# Patient Record
Sex: Female | Born: 1940 | Race: White | Hispanic: No | Marital: Married | Smoking: Never smoker
Health system: Southern US, Community
[De-identification: ages and names within clinical notes are randomized; demographics above are authoritative.]

## PROBLEM LIST (undated history)

## (undated) DIAGNOSIS — M549 Dorsalgia, unspecified: Secondary | ICD-10-CM

## (undated) DIAGNOSIS — G629 Polyneuropathy, unspecified: Secondary | ICD-10-CM

## (undated) DIAGNOSIS — N189 Chronic kidney disease, unspecified: Secondary | ICD-10-CM

## (undated) DIAGNOSIS — C439 Malignant melanoma of skin, unspecified: Secondary | ICD-10-CM

## (undated) DIAGNOSIS — T753XXA Motion sickness, initial encounter: Secondary | ICD-10-CM

## (undated) DIAGNOSIS — M5416 Radiculopathy, lumbar region: Secondary | ICD-10-CM

## (undated) DIAGNOSIS — M1990 Unspecified osteoarthritis, unspecified site: Secondary | ICD-10-CM

## (undated) DIAGNOSIS — F419 Anxiety disorder, unspecified: Secondary | ICD-10-CM

## (undated) DIAGNOSIS — K589 Irritable bowel syndrome without diarrhea: Secondary | ICD-10-CM

## (undated) DIAGNOSIS — I10 Essential (primary) hypertension: Secondary | ICD-10-CM

## (undated) HISTORY — PX: ANTERIOR AND POSTERIOR REPAIR: SHX1172

## (undated) HISTORY — PX: MELANOMA EXCISION: SHX5266

## (undated) HISTORY — PX: ABDOMINAL HYSTERECTOMY: SHX81

## (undated) HISTORY — PX: BREAST BIOPSY: SHX20

---

## 2004-11-28 ENCOUNTER — Ambulatory Visit: Payer: Self-pay | Admitting: General Surgery

## 2006-01-07 ENCOUNTER — Ambulatory Visit: Payer: Self-pay | Admitting: General Surgery

## 2007-01-14 ENCOUNTER — Ambulatory Visit: Payer: Self-pay | Admitting: General Surgery

## 2007-02-16 ENCOUNTER — Ambulatory Visit: Payer: Self-pay | Admitting: General Surgery

## 2007-08-12 ENCOUNTER — Ambulatory Visit: Payer: Self-pay | Admitting: General Surgery

## 2008-03-02 ENCOUNTER — Ambulatory Visit: Payer: Self-pay | Admitting: General Surgery

## 2009-03-03 ENCOUNTER — Ambulatory Visit: Payer: Self-pay | Admitting: General Surgery

## 2010-03-05 ENCOUNTER — Ambulatory Visit: Payer: Self-pay

## 2011-03-04 DEATH — deceased

## 2011-03-07 ENCOUNTER — Ambulatory Visit: Payer: Self-pay

## 2011-07-20 ENCOUNTER — Ambulatory Visit: Payer: Self-pay | Admitting: Family Medicine

## 2011-11-04 DIAGNOSIS — R251 Tremor, unspecified: Secondary | ICD-10-CM | POA: Insufficient documentation

## 2012-02-20 DIAGNOSIS — N183 Chronic kidney disease, stage 3 (moderate): Secondary | ICD-10-CM

## 2012-03-10 ENCOUNTER — Ambulatory Visit: Payer: Self-pay

## 2013-04-14 ENCOUNTER — Ambulatory Visit: Payer: Self-pay | Admitting: Family Medicine

## 2014-10-24 ENCOUNTER — Encounter: Payer: Self-pay | Admitting: *Deleted

## 2014-10-25 NOTE — Anesthesia Preprocedure Evaluation (Addendum)
Anesthesia Evaluation  Patient identified by MRN, date of birth, ID band Patient awake    Reviewed: Allergy & Precautions, NPO status , Patient's Chart, lab work & pertinent test results  Airway Mallampati: II  TM Distance: >3 FB Neck ROM: Full    Dental   Pulmonary    Pulmonary exam normal       Cardiovascular hypertension, Normal cardiovascular exam    Neuro/Psych    GI/Hepatic   Endo/Other    Renal/GU CRFRenal disease     Musculoskeletal  (+) Arthritis -,   Abdominal   Peds  Hematology   Anesthesia Other Findings   Reproductive/Obstetrics                            Anesthesia Physical Anesthesia Plan  ASA: II  Anesthesia Plan: MAC   Post-op Pain Management:    Induction: Intravenous  Airway Management Planned: Nasal Cannula  Additional Equipment:   Intra-op Plan:   Post-operative Plan:   Informed Consent: I have reviewed the patients History and Physical, chart, labs and discussed the procedure including the risks, benefits and alternatives for the proposed anesthesia with the patient or authorized representative who has indicated his/her understanding and acceptance.     Plan Discussed with: CRNA  Anesthesia Plan Comments:         Anesthesia Quick Evaluation

## 2014-10-26 NOTE — Discharge Instructions (Signed)

## 2014-10-28 ENCOUNTER — Ambulatory Visit: Payer: Medicare Other | Admitting: Anesthesiology

## 2014-10-28 ENCOUNTER — Ambulatory Visit
Admission: RE | Admit: 2014-10-28 | Discharge: 2014-10-28 | Disposition: A | Discharge status: Home / Self Care | Payer: Medicare Other | Source: Ambulatory Visit | Attending: Gastroenterology | Admitting: Gastroenterology

## 2014-10-28 ENCOUNTER — Encounter
Admission: RE | Disposition: A | Discharge status: Home / Self Care | Payer: Self-pay | Source: Ambulatory Visit | Attending: Gastroenterology

## 2014-10-28 DIAGNOSIS — Z79899 Other long term (current) drug therapy: Secondary | ICD-10-CM | POA: Insufficient documentation

## 2014-10-28 DIAGNOSIS — I129 Hypertensive chronic kidney disease with stage 1 through stage 4 chronic kidney disease, or unspecified chronic kidney disease: Secondary | ICD-10-CM | POA: Insufficient documentation

## 2014-10-28 DIAGNOSIS — Z1211 Encounter for screening for malignant neoplasm of colon: Secondary | ICD-10-CM | POA: Diagnosis not present

## 2014-10-28 DIAGNOSIS — K648 Other hemorrhoids: Secondary | ICD-10-CM | POA: Insufficient documentation

## 2014-10-28 DIAGNOSIS — M1990 Unspecified osteoarthritis, unspecified site: Secondary | ICD-10-CM | POA: Diagnosis not present

## 2014-10-28 DIAGNOSIS — N183 Chronic kidney disease, stage 3 (moderate): Secondary | ICD-10-CM | POA: Insufficient documentation

## 2014-10-28 DIAGNOSIS — Z7982 Long term (current) use of aspirin: Secondary | ICD-10-CM | POA: Diagnosis not present

## 2014-10-28 HISTORY — DX: Unspecified osteoarthritis, unspecified site: M19.90

## 2014-10-28 HISTORY — DX: Motion sickness, initial encounter: T75.3XXA

## 2014-10-28 HISTORY — DX: Dorsalgia, unspecified: M54.9

## 2014-10-28 HISTORY — DX: Malignant melanoma of skin, unspecified: C43.9

## 2014-10-28 HISTORY — DX: Chronic kidney disease, unspecified: N18.9

## 2014-10-28 HISTORY — DX: Essential (primary) hypertension: I10

## 2014-10-28 HISTORY — PX: COLONOSCOPY: SHX5424

## 2014-10-28 SURGERY — COLONOSCOPY
Anesthesia: Monitor Anesthesia Care | Wound class: Contaminated

## 2014-10-28 MED ORDER — ACETAMINOPHEN 160 MG/5ML PO SOLN: 325.0000 mg | ORAL | Status: DC | PRN

## 2014-10-28 MED ORDER — LACTATED RINGERS IV SOLN
INTRAVENOUS | Status: DC
  Administered 2014-10-28 (×2): via INTRAVENOUS

## 2014-10-28 MED ORDER — ONDANSETRON HCL 4 MG/2ML IJ SOLN: 4.0000 mg | Freq: Once | INTRAMUSCULAR | Status: DC | PRN

## 2014-10-28 MED ORDER — ACETAMINOPHEN 325 MG PO TABS: 325.0000 mg | ORAL_TABLET | ORAL | Status: DC | PRN

## 2014-10-28 MED ORDER — PROPOFOL 10 MG/ML IV BOLUS
INTRAVENOUS | Status: DC | PRN
  Administered 2014-10-28: 80 mg via INTRAVENOUS
  Administered 2014-10-28: 40 mg via INTRAVENOUS
  Administered 2014-10-28: 80 mg via INTRAVENOUS
  Administered 2014-10-28: 20 mg via INTRAVENOUS

## 2014-10-28 MED ORDER — LIDOCAINE HCL (CARDIAC) 20 MG/ML IV SOLN
INTRAVENOUS | Status: DC | PRN
  Administered 2014-10-28: 30 mg via INTRAVENOUS

## 2014-10-28 MED ORDER — STERILE WATER FOR IRRIGATION IR SOLN
Status: DC | PRN
  Administered 2014-10-28: 09:00:00

## 2014-10-28 SURGICAL SUPPLY — 28 items
CANISTER SUCT 1200ML W/VALVE (MISCELLANEOUS) ×2 IMPLANT
FCP ESCP3.2XJMB 240X2.8X (MISCELLANEOUS)
FORCEPS BIOP RAD 4 LRG CAP 4 (CUTTING FORCEPS) IMPLANT
FORCEPS BIOP RJ4 240 W/NDL (MISCELLANEOUS)
FORCEPS ESCP3.2XJMB 240X2.8X (MISCELLANEOUS) IMPLANT
GOWN CVR UNV OPN BCK APRN NK (MISCELLANEOUS) ×2 IMPLANT
GOWN ISOL THUMB LOOP REG UNIV (MISCELLANEOUS) ×2
HEMOCLIP INSTINCT (CLIP) IMPLANT
INJECTOR VARIJECT VIN23 (MISCELLANEOUS) IMPLANT
KIT CO2 TUBING (TUBING) ×2 IMPLANT
KIT DEFENDO VALVE AND CONN (KITS) IMPLANT
KIT ENDO PROCEDURE OLY (KITS) ×2 IMPLANT
LIGATOR MULTIBAND 6SHOOTER MBL (MISCELLANEOUS) IMPLANT
MARKER SPOT ENDO TATTOO 5ML (MISCELLANEOUS) IMPLANT
PAD GROUND ADULT SPLIT (MISCELLANEOUS) IMPLANT
SNARE SHORT THROW 13M SML OVAL (MISCELLANEOUS) IMPLANT
SNARE SHORT THROW 30M LRG OVAL (MISCELLANEOUS) IMPLANT
SPOT EX ENDOSCOPIC TATTOO (MISCELLANEOUS)
SUCTION POLY TRAP 4CHAMBER (MISCELLANEOUS) IMPLANT
TRAP SUCTION POLY (MISCELLANEOUS) IMPLANT
TUBING CONN 6MMX3.1M (TUBING)
TUBING SUCTION CONN 0.25 STRL (TUBING) IMPLANT
UNDERPAD 30X60 958B10 (PK) (MISCELLANEOUS) IMPLANT
VALVE BIOPSY ENDO (VALVE) IMPLANT
VARIJECT INJECTOR VIN23 (MISCELLANEOUS)
WATER AUXILLARY (MISCELLANEOUS) IMPLANT
WATER STERILE IRR 250ML POUR (IV SOLUTION) ×2 IMPLANT
WATER STERILE IRR 500ML POUR (IV SOLUTION) IMPLANT

## 2014-10-28 NOTE — H&P (Signed)
[facility]  [address] [place], [place] [address] Phone: [phone] Fax : [phone]  Primary Care Physician:  No primary care provider on file. Primary Gastroenterologist:  Dr. Allen Norris  Pre-Procedure History & Physical: HPI:  Dominique Spencer is a 74 y.o. female is here for a screening colonoscopy.   Past Medical History  Diagnosis Date  . Hypertension   . Chronic kidney disease     stage III  . Back pain     SI joint  . Arthritis     hip  . Motion sickness     boats  . Skin cancer (melanoma)     Past Surgical History  Procedure Laterality Date  . Abdominal hysterectomy    . Anterior and posterior repair    . Melanoma excision      Prior to Admission medications   Medication Sig Start Date End Date Taking? Authorizing Provider  aspirin 81 MG tablet Take 81 mg by mouth daily. 1/2 tab every other AM   Yes Historical Provider, MD  atorvastatin (LIPITOR) 10 MG tablet Take 5 mg by mouth daily. PM   Yes Historical Provider, MD  Calcium Carbonate-Vitamin D (CALCIUM 600-D PO) Take by mouth.   Yes Historical Provider, MD  Cholecalciferol (VITAMIN D PO) Take by mouth.   Yes Historical Provider, MD  citalopram (CELEXA) 10 MG tablet Take 10 mg by mouth daily. AM   Yes Historical Provider, MD  Cyanocobalamin (VITAMIN B-12 PO) Take by mouth.   Yes Historical Provider, MD  Glucosamine-Chondroitin (GLUCOSAMINE CHONDR COMPLEX PO) Take by mouth.   Yes Historical Provider, MD  MAGNESIUM PO Take by mouth.   Yes Historical Provider, MD  olmesartan (BENICAR) 20 MG tablet Take 10 mg by mouth daily. AM   Yes Historical Provider, MD  Omega-3 Fatty Acids (FISH OIL PO) Take by mouth.   Yes Historical Provider, MD  TURMERIC CURCUMIN PO Take by mouth.   Yes Historical Provider, MD  VITAMIN A PO Take by mouth.   Yes Historical Provider, MD    Allergies as of 10/13/2014  . (Not on File)    History reviewed. No pertinent family history.  History   Social History  .  Marital Status: Married    Spouse Name: N/A  . Number of Children: N/A  . Years of Education: N/A   Occupational History  . Not on file.   Social History Main Topics  . Smoking status: Never Smoker   . Smokeless tobacco: Not on file  . Alcohol Use: No  . Drug Use: Not on file  . Sexual Activity: Not on file   Other Topics Concern  . Not on file   Social History Narrative  . No narrative on file    Review of Systems: See HPI, otherwise negative ROS  Physical Exam: BP 158/64 mmHg  Pulse 71  Temp(Src) 98.1 F (36.7 C) (Temporal)  Resp 16  Ht 5\' 8"  (1.727 m)  Wt 161 lb (73.029 kg)  BMI 24.49 kg/m2  SpO2 100% General:   Alert,  pleasant and cooperative in NAD Head:  Normocephalic and atraumatic. Neck:  Supple; no masses or thyromegaly. Lungs:  Clear throughout to auscultation.    Heart:  Regular rate and rhythm. Abdomen:  Soft, nontender and nondistended. Normal bowel sounds, without guarding, and without rebound.   Neurologic:  Alert and  oriented x4;  grossly normal neurologically.  Impression/Plan: Dominique Spencer is now here to undergo a screening colonoscopy.  Risks, benefits, and  alternatives regarding colonoscopy have been reviewed with the patient.  Questions have been answered.  All parties agreeable.

## 2014-10-28 NOTE — Op Note (Signed)
[facility] Gastroenterology Patient Name: Dominique Spencer Procedure Date: 10/28/2014 8:30 AM MRN: [id number] Account #: [id number] Date of Birth: 1941/01/15 Admit Type: Outpatient Age: 74 Room: [facility] OR ROOM 01 Gender: Female Note Status: Finalized Procedure:         Colonoscopy Indications:       Screening for colorectal malignant neoplasm Providers:         Lucilla Lame, MD Referring MD:      Kerin Perna, MD (Referring MD) Medicines:         Propofol per Anesthesia Complications:     No immediate complications. Procedure:         Pre-Anesthesia Assessment:                    - Prior to the procedure, a History and Physical was                     performed, and patient medications and allergies were                     reviewed. The patient's tolerance of previous anesthesia                     was also reviewed. The risks and benefits of the procedure                     and the sedation options and risks were discussed with the                     patient. All questions were answered, and informed consent                     was obtained. Prior Anticoagulants: The patient has taken                     no previous anticoagulant or antiplatelet agents. ASA                     Grade Assessment: II - A patient with mild systemic                     disease. After reviewing the risks and benefits, the                     patient was deemed in satisfactory condition to undergo                     the procedure.                    After obtaining informed consent, the colonoscope was                     passed under direct vision. Throughout the procedure, the                     patient's blood pressure, pulse, and oxygen saturations                     were monitored continuously. The Olympus PCF H180AL                     colonoscope (S#: [id number]) was introduced through the anus  and advanced to the the cecum, identified by appendiceal            orifice and ileocecal valve. The colonoscopy was performed                     without difficulty. The patient tolerated the procedure                     well. The quality of the bowel preparation was excellent. Findings:      The perianal and digital rectal examinations were normal.      Non-bleeding internal hemorrhoids were found during retroflexion. The       hemorrhoids were Grade I (internal hemorrhoids that do not prolapse). Impression:        - Non-bleeding internal hemorrhoids.                    - No specimens collected. Recommendation:    - Continue present medications. Procedure Code(s): --- Professional ---                    [id number], Colonoscopy, flexible; diagnostic, including                     collection of specimen(s) by brushing or washing, when                     performed (separate procedure) Diagnosis Code(s): --- Professional ---                    Z12.11, Encounter for screening for malignant neoplasm of                     colon CPT copyright 2014 American Medical Association. All rights reserved. The codes documented in this report are preliminary and upon coder review may  be revised to meet current compliance requirements. Lucilla Lame, MD 10/28/2014 8:54:29 AM This report has been signed electronically. Number of Addenda: 0 Note Initiated On: 10/28/2014 8:30 AM Scope Withdrawal Time: 0 hours 7 minutes 25 seconds  Total Procedure Duration: 0 hours 11 minutes 16 seconds       [facility]

## 2014-10-28 NOTE — Transfer of Care (Signed)
Immediate Anesthesia Transfer of Care Note  Patient: Dominique Spencer  Procedure(s) Performed: Procedure(s): COLONOSCOPY (N/A)  Patient Location: PACU  Anesthesia Type: MAC  Level of Consciousness: awake, alert  and patient cooperative  Airway and Oxygen Therapy: Patient Spontanous Breathing and Patient connected to supplemental oxygen  Post-op Assessment: Post-op Vital signs reviewed, Patient's Cardiovascular Status Stable, Respiratory Function Stable, Patent Airway and No signs of Nausea or vomiting  Post-op Vital Signs: Reviewed and stable  Complications: No apparent anesthesia complications

## 2014-10-28 NOTE — Anesthesia Procedure Notes (Signed)
Procedure Name: MAC Performed by: Astra Gregg Pre-anesthesia Checklist: Patient identified, Emergency Drugs available, Suction available, Patient being monitored and Timeout performed Patient Re-evaluated:Patient Re-evaluated prior to inductionOxygen Delivery Method: Nasal cannula       

## 2014-10-28 NOTE — Anesthesia Postprocedure Evaluation (Signed)
  Anesthesia Post-op Note  Patient: Dominique Spencer  Procedure(s) Performed: Procedure(s): COLONOSCOPY (N/A)  Anesthesia type:MAC  Patient location: PACU  Post pain: Pain level controlled  Post assessment: Post-op Vital signs reviewed, Patient's Cardiovascular Status Stable, Respiratory Function Stable, Patent Airway and No signs of Nausea or vomiting  Post vital signs: Reviewed and stable  Last Vitals:  Filed Vitals:   10/28/14 0900  BP:   Pulse: 61  Temp: 36 C  Resp: 15    Level of consciousness: awake, alert  and patient cooperative  Complications: No apparent anesthesia complications

## 2014-11-01 ENCOUNTER — Encounter: Payer: Self-pay | Admitting: Gastroenterology

## 2014-11-02 ENCOUNTER — Ambulatory Visit (INDEPENDENT_AMBULATORY_CARE_PROVIDER_SITE_OTHER): Payer: Medicare Other | Admitting: Gastroenterology

## 2014-11-02 ENCOUNTER — Encounter: Payer: Self-pay | Admitting: Gastroenterology

## 2014-11-02 VITALS — BP 134/64 | HR 74 | Temp 97.8°F | Ht 67.0 in | Wt 163.0 lb

## 2014-11-02 DIAGNOSIS — R197 Diarrhea, unspecified: Secondary | ICD-10-CM | POA: Diagnosis not present

## 2014-11-02 DIAGNOSIS — F329 Major depressive disorder, single episode, unspecified: Secondary | ICD-10-CM | POA: Insufficient documentation

## 2014-11-02 DIAGNOSIS — R112 Nausea with vomiting, unspecified: Secondary | ICD-10-CM | POA: Diagnosis not present

## 2014-11-02 DIAGNOSIS — C439 Malignant melanoma of skin, unspecified: Secondary | ICD-10-CM | POA: Insufficient documentation

## 2014-11-02 DIAGNOSIS — K921 Melena: Secondary | ICD-10-CM | POA: Insufficient documentation

## 2014-11-02 DIAGNOSIS — F32A Depression, unspecified: Secondary | ICD-10-CM | POA: Insufficient documentation

## 2014-11-02 DIAGNOSIS — E7800 Pure hypercholesterolemia, unspecified: Secondary | ICD-10-CM | POA: Insufficient documentation

## 2014-11-02 DIAGNOSIS — I10 Essential (primary) hypertension: Secondary | ICD-10-CM | POA: Insufficient documentation

## 2014-11-02 NOTE — Progress Notes (Signed)
  [facility]  [address] [place], [place] [address] Phone: [phone] Fax : [phone]      Primary Care Physician: Hortencia Pilar, MD  Primary Gastroenterologist:  Dr. Lucilla Lame  Chief Complaint  Patient presents with  . Follow up Colonoscopy    Rectal bleeding, vomiting    HPI: Dominique Spencer is a 74 y.o. female here for nausea and vomiting with diarrhea. The patient reports that the nausea and vomiting started Monday night and then she had diarrhea the next day. She had a colonoscopy that was normal on the previous Friday. She also noted some bright red blood with her stools. The patient was noted to have a normal colonoscopy except that she had hemorrhoids. She also reports lower abd pain that started with the nausea and vomiting. She says that she has no further nausea or vomiting and the diarrhea has resolved but she does not feel back to her normal self. She reports that there are no sick contacts.   Current Outpatient Prescriptions  Medication Sig Dispense Refill  . aspirin 81 MG tablet Take 81 mg by mouth daily.     Marland Kitchen atorvastatin (LIPITOR) 10 MG tablet Take 5 mg by mouth daily. PM    . Calcium Carbonate-Vitamin D (CALCIUM 600-D PO) Take by mouth.    . citalopram (CELEXA) 10 MG tablet Take 10 mg by mouth daily. AM    . Cyanocobalamin (VITAMIN B-12 PO) Take by mouth.    . Glucosamine-Chondroitin (GLUCOSAMINE CHONDR COMPLEX PO) Take by mouth.    Marland Kitchen MAGNESIUM PO Take by mouth.    . olmesartan (BENICAR) 20 MG tablet Take 10 mg by mouth daily. AM    . Omega-3 Fatty Acids (FISH OIL PO) Take by mouth.    . TURMERIC CURCUMIN PO Take by mouth.    Marland Kitchen VITAMIN A PO Take by mouth.    . Cholecalciferol (VITAMIN D PO) Take by mouth.     No current facility-administered medications for this visit.    Allergies as of 11/02/2014 - Review Complete 11/02/2014  Allergen Reaction Noted  . Adhesive [tape] Other (See Comments) 10/24/2014  . Lactose intolerance  (gi)  10/24/2014  . Erythromycin Rash 10/24/2014  . Penicillins Rash 10/24/2014    ROS:  General: Negative for anorexia, weight loss, fever, chills, fatigue, weakness. ENT: Negative for hoarseness, difficulty swallowing , nasal congestion. CV: Negative for chest pain, angina, palpitations, dyspnea on exertion, peripheral edema.  Respiratory: Negative for dyspnea at rest, dyspnea on exertion, cough, sputum, wheezing.  GI: See history of present illness. GU:  Negative for dysuria, hematuria, urinary incontinence, urinary frequency, nocturnal urination.  Endo: Negative for unusual weight change.    Physical Examination:   BP 134/64 mmHg  Pulse 74  Temp(Src) 97.8 F (36.6 C) (Oral)  Ht 5\' 7"  (1.702 m)  Wt 163 lb (73.936 kg)  BMI 25.52 kg/m2  General: Well-nourished, well-developed in no acute distress.  Eyes: No icterus. Conjunctivae pink. Mouth: Oropharyngeal mucosa moist and pink , no lesions erythema or exudate. Abdomen: Bowel sounds are normal, nontender, nondistended, no hepatosplenomegaly or masses, no abdominal bruits or hernia , no rebound or guarding.   Extremities: No lower extremity edema. No clubbing or deformities. Neuro: Alert and oriented x 3.  Grossly intact. Skin: Warm and dry, no jaundice.   Psych: Alert and cooperative, normal mood and affect.    Imaging Studies: No results found.

## 2014-11-02 NOTE — Assessment & Plan Note (Deleted)
  [facility]  [address], [place], [place] [address] Phone: [phone] Fax : [phone]      Primary Care Physician: Hortencia Pilar, MD  Primary Gastroenterologist:  Dr. Lucilla Lame  Chief Complaint  Patient presents with  . Follow up Colonoscopy    Rectal bleeding, vomiting    HPI: Dominique Spencer is a 74 y.o. female here for nausea and vomiting with diarrhea. The patient reports that the nausea and vomiting started Monday night and then she had diarrhea the next day. She had a colonoscopy that was normal on the previous Friday. She also noted some bright red blood with her stools. The patient was noted to have a normal colonoscopy except that she had hemorrhoids. She also reports lower abd pain that started with the nausea and vomiting. She says that she has no further nausea or vomiting and the diarrhea has resolved but she does not feel back to her normal self. She reports that there are no sick contacts.   Current Outpatient Prescriptions  Medication Sig Dispense Refill  . aspirin 81 MG tablet Take 81 mg by mouth daily.     Marland Kitchen atorvastatin (LIPITOR) 10 MG tablet Take 5 mg by mouth daily. PM    . Calcium Carbonate-Vitamin D (CALCIUM 600-D PO) Take by mouth.    . citalopram (CELEXA) 10 MG tablet Take 10 mg by mouth daily. AM    . Cyanocobalamin (VITAMIN B-12 PO) Take by mouth.    . Glucosamine-Chondroitin (GLUCOSAMINE CHONDR COMPLEX PO) Take by mouth.    Marland Kitchen MAGNESIUM PO Take by mouth.    . olmesartan (BENICAR) 20 MG tablet Take 10 mg by mouth daily. AM    . Omega-3 Fatty Acids (FISH OIL PO) Take by mouth.    . TURMERIC CURCUMIN PO Take by mouth.    Marland Kitchen VITAMIN A PO Take by mouth.    . Cholecalciferol (VITAMIN D PO) Take by mouth.     No current facility-administered medications for this visit.    Allergies as of 11/02/2014 - Review Complete 11/02/2014  Allergen Reaction Noted  . Adhesive [tape] Other (See Comments) 10/24/2014  . Lactose intolerance  (gi)  10/24/2014  . Erythromycin Rash 10/24/2014  . Penicillins Rash 10/24/2014    ROS:  General: Negative for anorexia, weight loss, fever, chills, fatigue, weakness. ENT: Negative for hoarseness, difficulty swallowing , nasal congestion. CV: Negative for chest pain, angina, palpitations, dyspnea on exertion, peripheral edema.  Respiratory: Negative for dyspnea at rest, dyspnea on exertion, cough, sputum, wheezing.  GI: See history of present illness. GU:  Negative for dysuria, hematuria, urinary incontinence, urinary frequency, nocturnal urination.  Endo: Negative for unusual weight change.    Physical Examination:   BP 134/64 mmHg  Pulse 74  Temp(Src) 97.8 F (36.6 C) (Oral)  Ht 5\' 7"  (1.702 m)  Wt 163 lb (73.936 kg)  BMI 25.52 kg/m2  General: Well-nourished, well-developed in no acute distress.  Eyes: No icterus. Conjunctivae pink. Mouth: Oropharyngeal mucosa moist and pink , no lesions erythema or exudate. Abdomen: Bowel sounds are normal, nontender, nondistended, no hepatosplenomegaly or masses, no abdominal bruits or hernia , no rebound or guarding.   Extremities: No lower extremity edema. No clubbing or deformities. Neuro: Alert and oriented x 3.  Grossly intact. Skin: Warm and dry, no jaundice.   Psych: Alert and cooperative, normal mood and affect.    Imaging Studies: No results found.

## 2014-11-02 NOTE — Assessment & Plan Note (Addendum)
This patient comes with nausea, vomiting with diarrhea and rectal bleeding that started 4 days after her colonoscopy. The patient likely had a viral gastroenteritis and she seems to be getting better. The patient has been told to start probiotics. She will call if symptoms get worse.

## 2016-06-20 DIAGNOSIS — G629 Polyneuropathy, unspecified: Secondary | ICD-10-CM | POA: Insufficient documentation

## 2017-05-15 ENCOUNTER — Other Ambulatory Visit: Payer: Self-pay | Admitting: Family Medicine

## 2017-05-15 DIAGNOSIS — Z780 Asymptomatic menopausal state: Secondary | ICD-10-CM

## 2017-05-19 ENCOUNTER — Other Ambulatory Visit: Payer: Self-pay | Admitting: Family Medicine

## 2017-05-19 DIAGNOSIS — Z1231 Encounter for screening mammogram for malignant neoplasm of breast: Secondary | ICD-10-CM

## 2017-05-29 ENCOUNTER — Encounter (INDEPENDENT_AMBULATORY_CARE_PROVIDER_SITE_OTHER): Payer: Self-pay

## 2017-05-29 ENCOUNTER — Ambulatory Visit
Admission: RE | Admit: 2017-05-29 | Discharge: 2017-05-29 | Disposition: A | Discharge status: Home / Self Care | Payer: Medicare Other | Source: Ambulatory Visit | Attending: Family Medicine | Admitting: Family Medicine

## 2017-05-29 DIAGNOSIS — Z780 Asymptomatic menopausal state: Secondary | ICD-10-CM | POA: Diagnosis present

## 2017-05-29 DIAGNOSIS — Z1231 Encounter for screening mammogram for malignant neoplasm of breast: Secondary | ICD-10-CM | POA: Insufficient documentation

## 2017-06-02 ENCOUNTER — Other Ambulatory Visit: Payer: Self-pay | Admitting: Family Medicine

## 2017-06-02 DIAGNOSIS — R928 Other abnormal and inconclusive findings on diagnostic imaging of breast: Secondary | ICD-10-CM

## 2017-06-02 DIAGNOSIS — N6489 Other specified disorders of breast: Secondary | ICD-10-CM

## 2017-06-11 ENCOUNTER — Inpatient Hospital Stay
Admission: RE | Admit: 2017-06-11 | Discharge: 2017-06-11 | Disposition: A | Discharge status: Home / Self Care | Payer: Self-pay | Source: Ambulatory Visit | Attending: *Deleted | Admitting: *Deleted

## 2017-06-11 ENCOUNTER — Other Ambulatory Visit: Payer: Self-pay | Admitting: *Deleted

## 2017-06-11 DIAGNOSIS — Z9289 Personal history of other medical treatment: Secondary | ICD-10-CM

## 2017-09-11 ENCOUNTER — Ambulatory Visit: Payer: Medicare Other

## 2017-09-11 ENCOUNTER — Other Ambulatory Visit: Payer: Medicare Other

## 2017-11-07 ENCOUNTER — Inpatient Hospital Stay
Admission: RE | Admit: 2017-11-07 | Discharge: 2017-11-07 | Disposition: A | Discharge status: Home / Self Care | Payer: Self-pay | Source: Ambulatory Visit | Attending: *Deleted | Admitting: *Deleted

## 2017-11-07 ENCOUNTER — Other Ambulatory Visit: Payer: Self-pay | Admitting: *Deleted

## 2017-11-07 DIAGNOSIS — Z9289 Personal history of other medical treatment: Secondary | ICD-10-CM

## 2017-11-18 ENCOUNTER — Other Ambulatory Visit: Payer: Self-pay | Admitting: Family Medicine

## 2017-11-18 DIAGNOSIS — R928 Other abnormal and inconclusive findings on diagnostic imaging of breast: Secondary | ICD-10-CM

## 2017-11-24 ENCOUNTER — Other Ambulatory Visit: Payer: Self-pay | Admitting: Family Medicine

## 2017-11-24 DIAGNOSIS — R928 Other abnormal and inconclusive findings on diagnostic imaging of breast: Secondary | ICD-10-CM

## 2017-12-30 ENCOUNTER — Ambulatory Visit
Admission: RE | Admit: 2017-12-30 | Discharge: 2017-12-30 | Disposition: A | Discharge status: Home / Self Care | Payer: Medicare Other | Source: Ambulatory Visit | Attending: Family Medicine | Admitting: Family Medicine

## 2017-12-30 DIAGNOSIS — R928 Other abnormal and inconclusive findings on diagnostic imaging of breast: Secondary | ICD-10-CM | POA: Diagnosis not present

## 2018-03-16 ENCOUNTER — Other Ambulatory Visit: Payer: Self-pay | Admitting: Family Medicine

## 2018-03-16 DIAGNOSIS — R928 Other abnormal and inconclusive findings on diagnostic imaging of breast: Secondary | ICD-10-CM

## 2018-03-17 ENCOUNTER — Other Ambulatory Visit: Payer: Self-pay | Admitting: Family Medicine

## 2018-03-17 DIAGNOSIS — R928 Other abnormal and inconclusive findings on diagnostic imaging of breast: Secondary | ICD-10-CM

## 2018-05-22 ENCOUNTER — Other Ambulatory Visit: Payer: Self-pay | Admitting: Nephrology

## 2018-05-22 DIAGNOSIS — N183 Chronic kidney disease, stage 3 unspecified: Secondary | ICD-10-CM

## 2018-06-01 ENCOUNTER — Ambulatory Visit
Admission: RE | Admit: 2018-06-01 | Discharge: 2018-06-01 | Disposition: A | Discharge status: Home / Self Care | Payer: Medicare Other | Source: Ambulatory Visit | Attending: Family Medicine | Admitting: Family Medicine

## 2018-06-01 DIAGNOSIS — R928 Other abnormal and inconclusive findings on diagnostic imaging of breast: Secondary | ICD-10-CM

## 2018-06-02 ENCOUNTER — Ambulatory Visit
Admission: RE | Admit: 2018-06-02 | Discharge: 2018-06-02 | Disposition: A | Discharge status: Home / Self Care | Payer: Medicare Other | Source: Ambulatory Visit | Attending: Nephrology | Admitting: Nephrology

## 2018-06-02 DIAGNOSIS — N183 Chronic kidney disease, stage 3 unspecified: Secondary | ICD-10-CM

## 2018-12-09 ENCOUNTER — Other Ambulatory Visit: Payer: Self-pay | Admitting: Neurology

## 2018-12-09 DIAGNOSIS — R252 Cramp and spasm: Secondary | ICD-10-CM

## 2018-12-23 ENCOUNTER — Ambulatory Visit: Payer: Medicare Other

## 2018-12-25 ENCOUNTER — Ambulatory Visit
Admission: RE | Admit: 2018-12-25 | Discharge: 2018-12-25 | Disposition: A | Discharge status: Home / Self Care | Payer: Medicare Other | Source: Ambulatory Visit | Attending: Neurology | Admitting: Neurology

## 2018-12-25 ENCOUNTER — Other Ambulatory Visit: Payer: Self-pay

## 2018-12-25 DIAGNOSIS — R252 Cramp and spasm: Secondary | ICD-10-CM | POA: Insufficient documentation

## 2019-01-13 ENCOUNTER — Other Ambulatory Visit: Payer: Self-pay | Admitting: Family Medicine

## 2019-01-13 DIAGNOSIS — Z1231 Encounter for screening mammogram for malignant neoplasm of breast: Secondary | ICD-10-CM

## 2019-03-31 IMAGING — US US RENAL
1 series · 14 of 25 positions shown · non-contrast
Comparison: None.

CLINICAL DATA: 77-year-old female with stage 3 chronic kidney
disease.

EXAM:
RENAL / URINARY TRACT ULTRASOUND COMPLETE

[Series 1: us renal · 0.22mm/px · 14 of 38 slices shown]
[im 1/38]
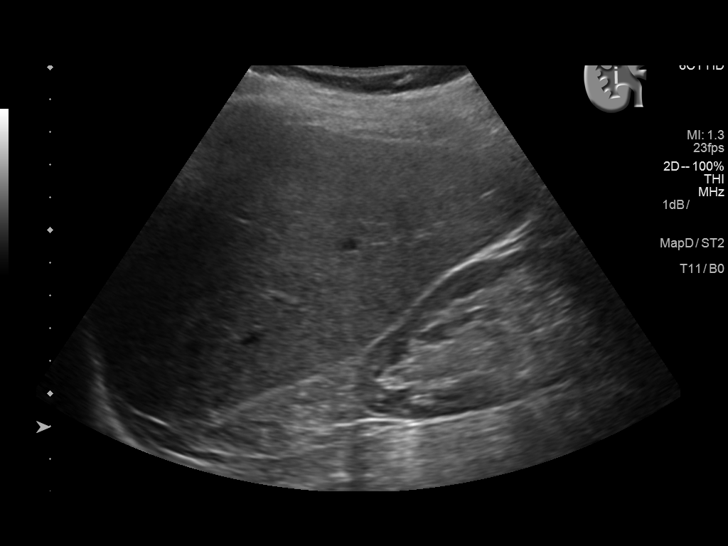
[im 4/38]
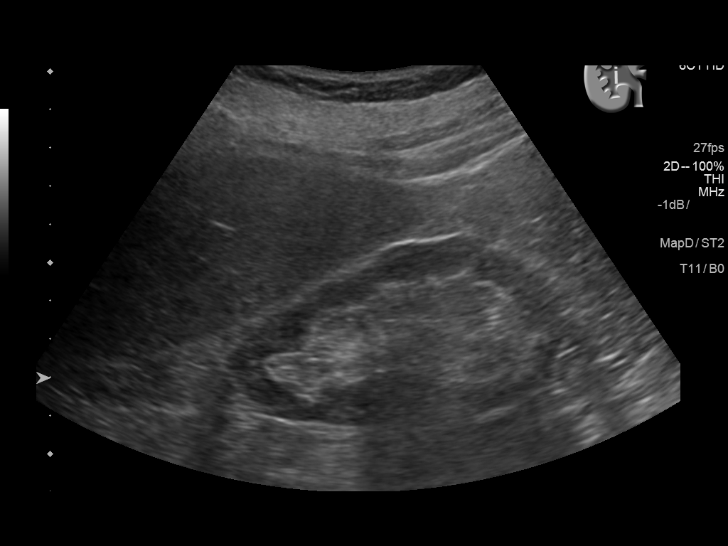
[im 7/38]
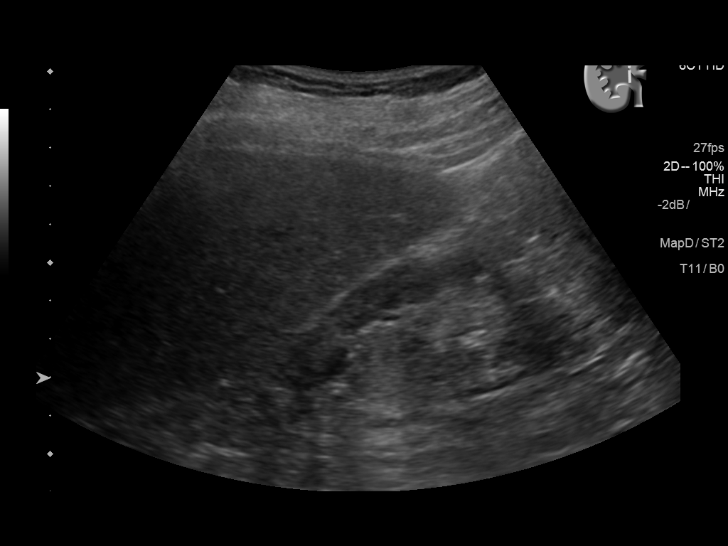
[im 10/38]
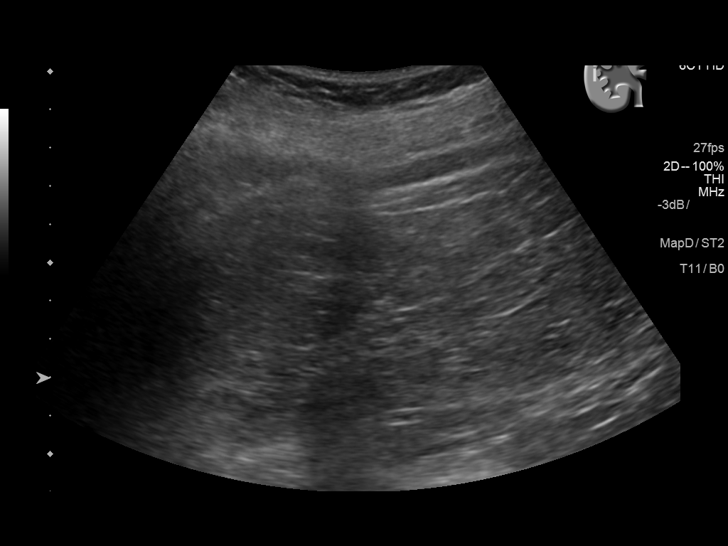
[im 13/38]
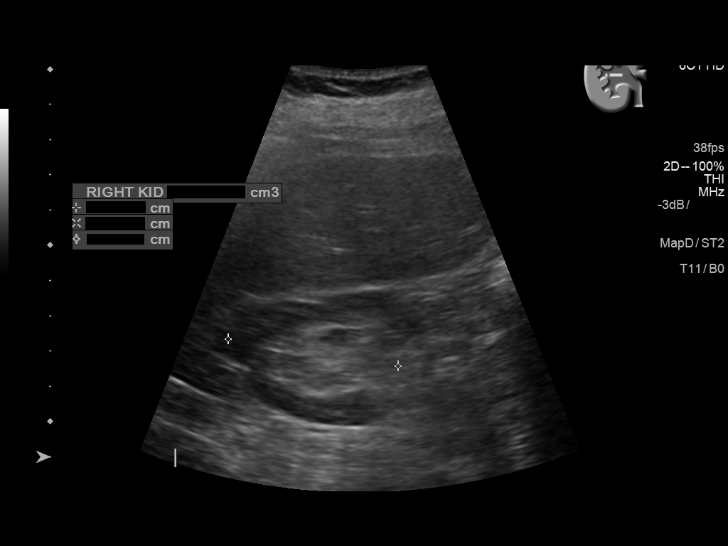
[im 14/38]
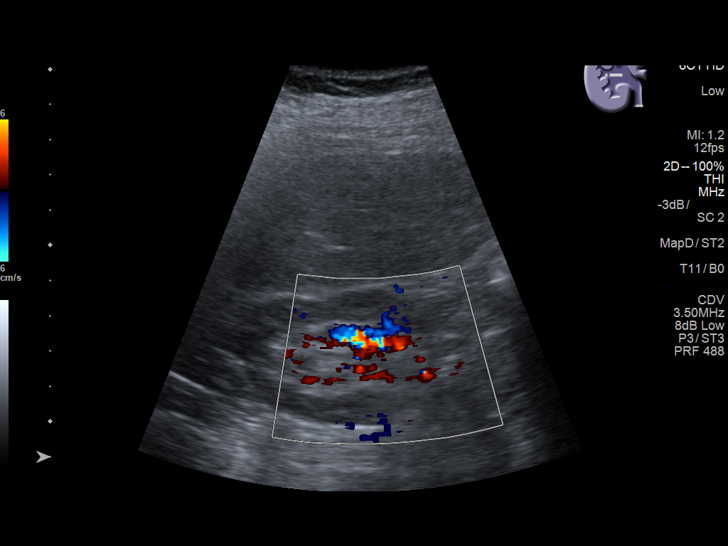
[im 17/38]
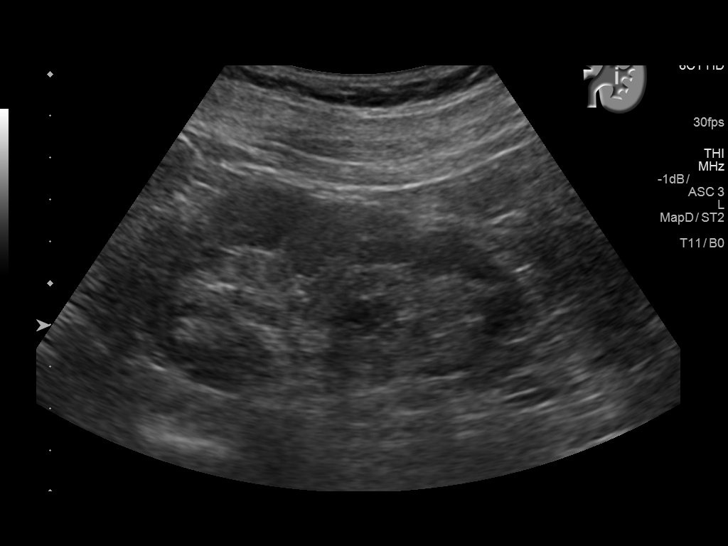
[im 21/38]
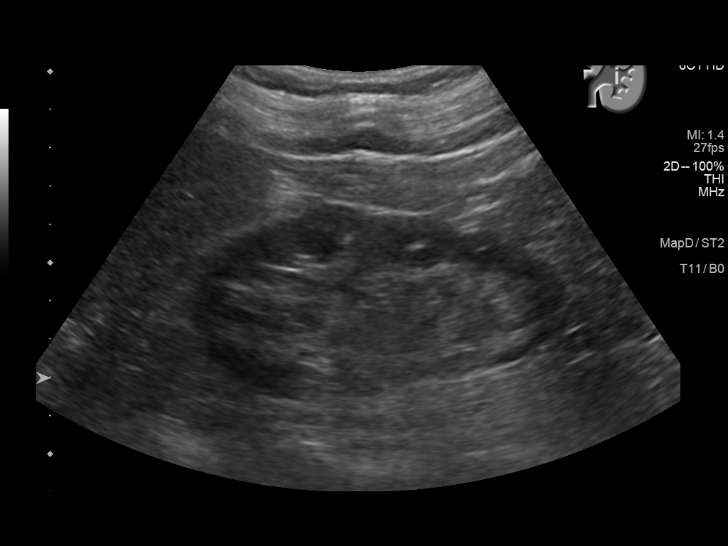
[im 24/38]
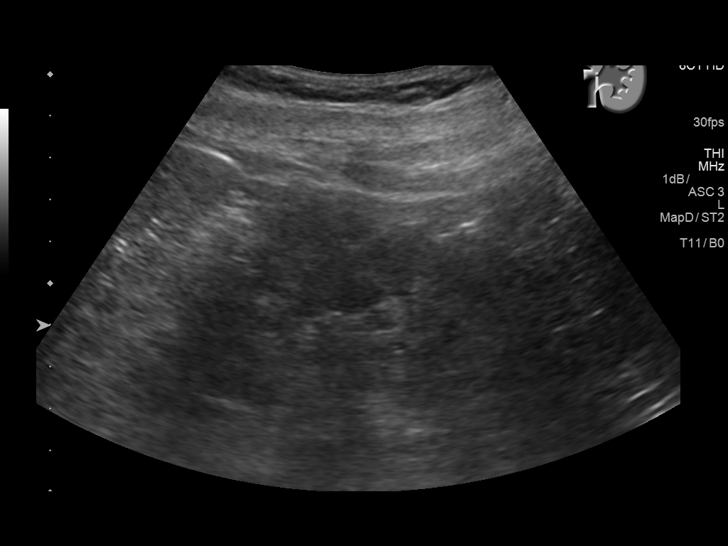
[im 25/38]
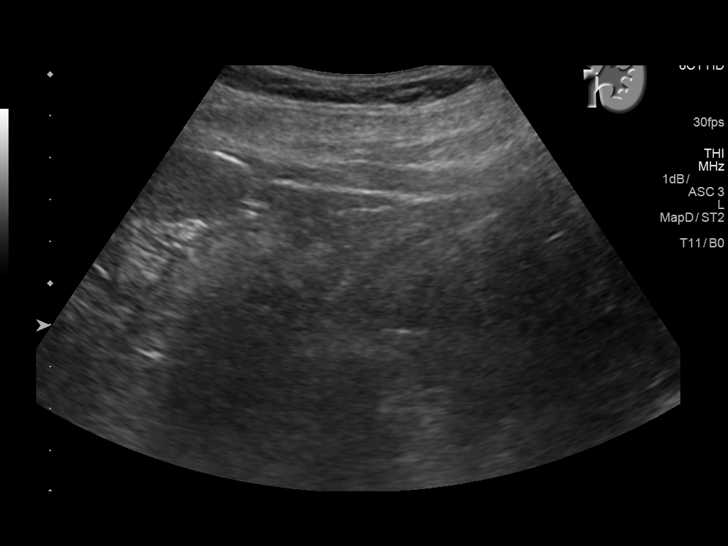
[im 28/38]
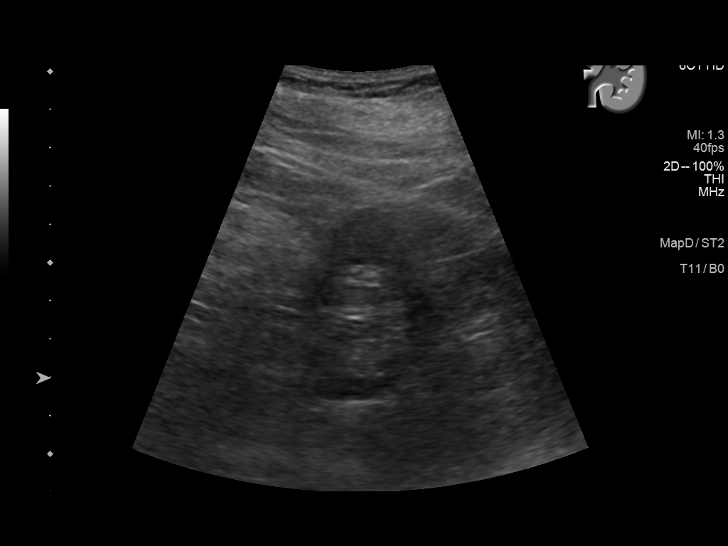
[im 31/38]
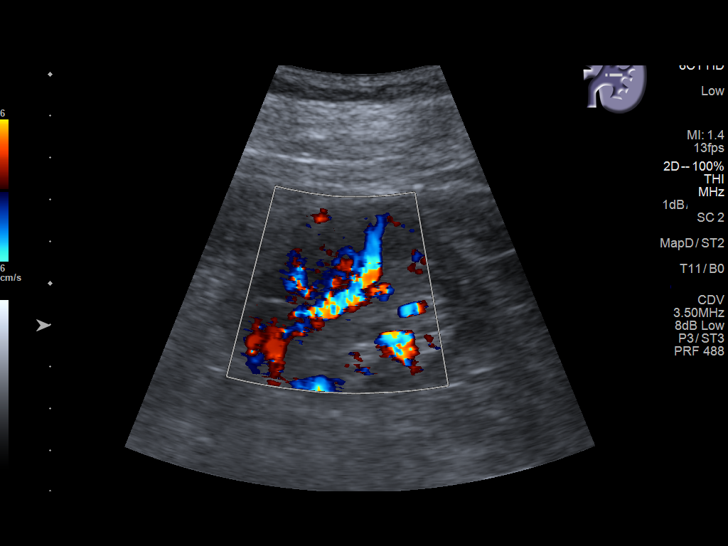
[im 34/38]
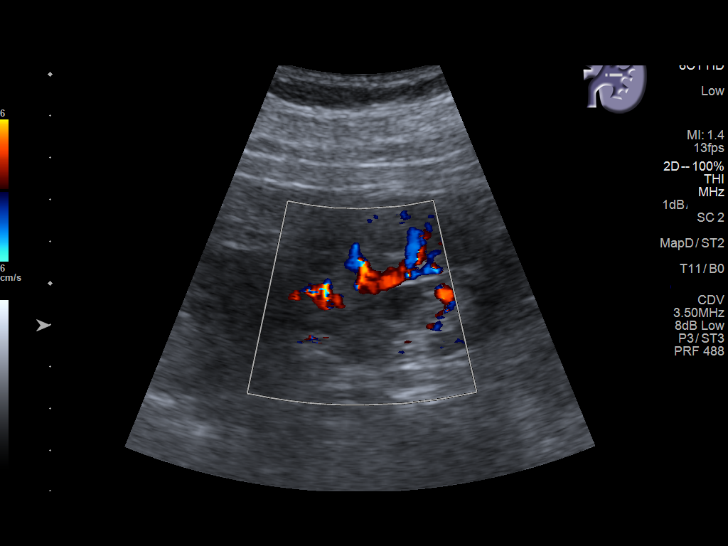
[im 38/38]
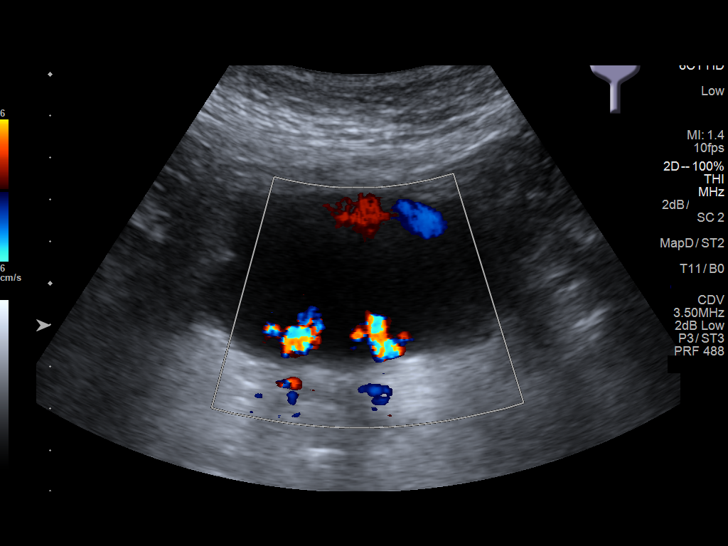

[14 of 25 positions shown; findings below may reference images not displayed]

FINDINGS: Right Kidney:

Renal measurements: 9.4 x 4.5 x 4.9 centimeters = volume: 108 mL .
Echogenicity within normal limits. No mass or hydronephrosis
visualized.

Left Kidney:

Renal measurements: 9.9 x 5.0 x 3.9 centimeters = volume: 100 mL.
Echogenicity within normal limits. Small 11 mm simple appearing
renal sinus cyst (image 40). No solid mass or hydronephrosis
visualized.

Bladder:

Appears normal for degree of bladder distention. Both ureteral jets
detected with Doppler.
IMPRESSION: Negative ultrasound appearance of the kidneys aside from cortical
volume loss. Normal urinary bladder.

## 2019-06-03 ENCOUNTER — Ambulatory Visit
Admission: RE | Admit: 2019-06-03 | Discharge: 2019-06-03 | Disposition: A | Discharge status: Home / Self Care | Payer: Medicare Other | Source: Ambulatory Visit | Attending: Family Medicine | Admitting: Family Medicine

## 2019-06-03 ENCOUNTER — Other Ambulatory Visit: Payer: Self-pay

## 2019-06-03 DIAGNOSIS — Z1231 Encounter for screening mammogram for malignant neoplasm of breast: Secondary | ICD-10-CM | POA: Insufficient documentation

## 2019-06-09 ENCOUNTER — Other Ambulatory Visit: Payer: Self-pay | Admitting: Family Medicine

## 2019-06-09 DIAGNOSIS — N6310 Unspecified lump in the right breast, unspecified quadrant: Secondary | ICD-10-CM

## 2019-06-09 DIAGNOSIS — R928 Other abnormal and inconclusive findings on diagnostic imaging of breast: Secondary | ICD-10-CM

## 2019-09-20 ENCOUNTER — Ambulatory Visit
Admission: RE | Admit: 2019-09-20 | Discharge: 2019-09-20 | Disposition: A | Discharge status: Home / Self Care | Payer: Medicare Other | Source: Ambulatory Visit | Attending: Family Medicine | Admitting: Family Medicine

## 2019-09-20 DIAGNOSIS — N6310 Unspecified lump in the right breast, unspecified quadrant: Secondary | ICD-10-CM

## 2019-09-20 DIAGNOSIS — R928 Other abnormal and inconclusive findings on diagnostic imaging of breast: Secondary | ICD-10-CM | POA: Insufficient documentation

## 2020-09-28 ENCOUNTER — Other Ambulatory Visit: Payer: Self-pay | Admitting: Nurse Practitioner

## 2020-09-28 DIAGNOSIS — Z1231 Encounter for screening mammogram for malignant neoplasm of breast: Secondary | ICD-10-CM

## 2020-10-05 ENCOUNTER — Other Ambulatory Visit: Payer: Self-pay

## 2020-10-05 ENCOUNTER — Ambulatory Visit
Admission: RE | Admit: 2020-10-05 | Discharge: 2020-10-05 | Disposition: A | Discharge status: Home / Self Care | Payer: Medicare Other | Source: Ambulatory Visit | Attending: Nurse Practitioner | Admitting: Nurse Practitioner

## 2020-10-05 DIAGNOSIS — Z1231 Encounter for screening mammogram for malignant neoplasm of breast: Secondary | ICD-10-CM | POA: Diagnosis present

## 2020-12-11 ENCOUNTER — Other Ambulatory Visit: Payer: Self-pay

## 2020-12-11 ENCOUNTER — Ambulatory Visit: Payer: Medicare Other | Admitting: Urology

## 2020-12-11 ENCOUNTER — Encounter: Payer: Self-pay | Admitting: Urology

## 2020-12-11 VITALS — BP 136/68 | HR 66 | Ht 66.0 in | Wt 159.0 lb

## 2020-12-11 DIAGNOSIS — N3941 Urge incontinence: Secondary | ICD-10-CM | POA: Diagnosis not present

## 2020-12-11 DIAGNOSIS — R32 Unspecified urinary incontinence: Secondary | ICD-10-CM

## 2020-12-11 LAB — URINALYSIS, COMPLETE
Bilirubin, UA: NEGATIVE
Glucose, UA: NEGATIVE
Nitrite, UA: NEGATIVE
Protein,UA: NEGATIVE
RBC, UA: NEGATIVE
Specific Gravity, UA: 1.02 (ref 1.005–1.030)
Urobilinogen, Ur: 0.2 mg/dL (ref 0.2–1.0)
pH, UA: 5.5 (ref 5.0–7.5)

## 2020-12-11 LAB — MICROSCOPIC EXAMINATION

## 2020-12-11 MED ORDER — MIRABEGRON ER 50 MG PO TB24: 50.0000 mg | ORAL_TABLET | Freq: Every day | ORAL | 11 refills | Status: DC

## 2020-12-11 NOTE — Addendum Note (Signed)
Addended by: Evelina Bucy on: 12/11/2020 02:46 PM   Modules accepted: Orders

## 2020-12-11 NOTE — Progress Notes (Addendum)
12/11/2020 1:58 PM   Dominique Spencer 19-Apr-1941 [id number]  Referring provider: Anthonette Legato, MD [place],  [place] [address]  Chief Complaint  Patient presents with   Urinary Incontinence    HPI: I was consulted to assess the patient's urgency incontinence.  She denies stress incontinence.  Sometimes she may have a little bit of bedwetting.  She can wear 2-3 pads a day sometimes soaked sometimes damp  She voids every 2 hours and is time voiding.  She started to get up once or twice a night.  Flow was good  She had an anterior and posterior repair 40 years ago.  She has had a hysterectomy.  She may have had a second bladder procedure at the time of the hysterectomy  No kidney stones or bladder infections.  No neurologic issues.  No treatment  On pelvic examination patient had moderate atrophy.  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 If you have any questions, ask your nurse or doctor.          aspirin 81 MG tablet Take 81 mg by mouth daily.   atorvastatin 10 MG tablet Commonly known  as: LIPITOR Take 5 mg by mouth daily. PM   CALCIUM 600-D PO Take by mouth.   citalopram 10 MG tablet Commonly known as: CELEXA Take 10 mg by mouth daily. AM   FISH OIL PO Take by mouth.   GLUCOSAMINE CHONDR COMPLEX PO Take by mouth.   MAGNESIUM PO Take by mouth.   olmesartan 20 MG tablet Commonly known as: BENICAR Take 10 mg by mouth daily. AM   TURMERIC CURCUMIN PO Take by mouth.   VITAMIN A PO Take by mouth.   VITAMIN B-12 PO Take by mouth.   VITAMIN D PO Take by mouth.        Allergies:  Allergies  Allergen Reactions   Adhesive [Tape] Other (See Comments)    blisters   Lactose Intolerance (Gi)    Erythromycin Rash   Penicillins Rash    Family History: Family History  Problem Relation Age of Onset   Breast cancer Cousin        mat cousin    Social History:  reports that she has never smoked. She has never used smokeless tobacco. She reports that she does not drink alcohol and  does not use drugs.  ROS:                                        Physical Exam: BP 136/68   Pulse 66   Ht 5\' 6"  (1.676 m)   Wt 72.1 kg   BMI 25.66 kg/m   Constitutional:  Alert and oriented, No acute distress. HEENT: [place] AT, moist mucus membranes.  Trachea midline, no masses.   Laboratory Data: No results found for: WBC, HGB, HCT, MCV, PLT  No results found for: CREATININE  No results found for: PSA  No results found for: TESTOSTERONE  No results found for: HGBA1C  Urinalysis No results found for: COLORURINE, APPEARANCEUR, LABSPEC, PHURINE, GLUCOSEU, HGBUR, BILIRUBINUR, KETONESUR, PROTEINUR, UROBILINOGEN, NITRITE, LEUKOCYTESUR  Pertinent Imaging: Urine reviewed.  Urine sent for culture.  Chart reviewed.  Assessment & Plan: Reassess patient with pelvic semination cystoscopy in 6 weeks on Myrbetriq 50 mg samples and prescription for urgency incontinence frequency and nocturia  1. Urinary incontinence, unspecified type  -  Urinalysis, Complete   No follow-ups on file.  Reece Packer, MD  [place] [address], [place], [place] [address] [phone]

## 2020-12-13 LAB — CULTURE, URINE COMPREHENSIVE

## 2021-01-22 ENCOUNTER — Other Ambulatory Visit: Payer: Self-pay

## 2021-01-22 ENCOUNTER — Ambulatory Visit: Payer: Medicare Other | Admitting: Urology

## 2021-01-22 ENCOUNTER — Encounter: Payer: Self-pay | Admitting: Urology

## 2021-01-22 VITALS — BP 134/71 | HR 69 | Ht 66.0 in | Wt 159.0 lb

## 2021-01-22 DIAGNOSIS — R32 Unspecified urinary incontinence: Secondary | ICD-10-CM

## 2021-01-22 DIAGNOSIS — N3946 Mixed incontinence: Secondary | ICD-10-CM

## 2021-01-22 LAB — MICROSCOPIC EXAMINATION
Bacteria, UA: NONE SEEN
RBC, Urine: NONE SEEN /hpf (ref 0–2)

## 2021-01-22 LAB — URINALYSIS, COMPLETE
Bilirubin, UA: NEGATIVE
Glucose, UA: NEGATIVE
Ketones, UA: NEGATIVE
Leukocytes,UA: NEGATIVE
Nitrite, UA: NEGATIVE
Protein,UA: NEGATIVE
RBC, UA: NEGATIVE
Specific Gravity, UA: 1.01 (ref 1.005–1.030)
Urobilinogen, Ur: 0.2 mg/dL (ref 0.2–1.0)
pH, UA: 6 (ref 5.0–7.5)

## 2021-01-22 NOTE — Progress Notes (Signed)
01/22/2021 1:41 PM   Dominique Spencer 1941/03/27 [id number]  Referring provider: Romualdo Bolk, FNP [address] [place],  [place] [address]  No chief complaint on file.   HPI: I was consulted to assess the patient's urgency incontinence.  She denies stress incontinence.  Sometimes she may have a little bit of bedwetting.  She can wear 2-3 pads a day sometimes soaked sometimes damp   She voids every 2 hours and is time voiding.  She started to get up once or twice a night.  Flow was good  She had an anterior and posterior repair 40 years ago.  She has had a hysterectomy.  She may have had a second bladder procedure at the time of the hysterectomy  On pelvic examination patient had moderate atrophy.  She had a high small grade 2 cystocele with no stress incontinence    Reassess patient with pelvic examination cystoscopy in 6 weeks on Myrbetriq 50 mg samples and prescription for urgency incontinence frequency and nocturia  Today Patient having less urge incontinence and more time to go to the restroom.  Frequency less.  Voiding small amounts and we discussed this.  Last culture negative  Clinically not infected  Cystoscopy: Patient underwent flexible cystoscopy.  Bladder mucosa and trigone were normal.  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    Surgical History: Past Surgical History:  Procedure Laterality Date   ABDOMINAL HYSTERECTOMY     ANTERIOR AND POSTERIOR REPAIR     BREAST BIOPSY Left    neg   COLONOSCOPY N/A 10/28/2014   Procedure: COLONOSCOPY;  Surgeon: Lucilla Lame, MD;  Location: [facility];  Service: Gastroenterology;  Laterality: N/A;   MELANOMA EXCISION      Home Medications:  Allergies as of 01/22/2021        Reactions   Adhesive [tape] Other (See Comments)   blisters   Lactose Diarrhea   Lactose Intolerance (gi)    Metformin Hcl Diarrhea   Erythromycin Rash   Penicillins Rash        Medication List        Accurate as of January 22, 2021  1:41 PM. If you have any questions, ask your nurse or doctor.          aspirin 81 MG tablet Take 81 mg by mouth daily.   atorvastatin 10 MG tablet Commonly known as: LIPITOR Take 5 mg by mouth daily. PM   CALCIUM 600-D PO Take by mouth.   citalopram 10 MG tablet Commonly known as: CELEXA Take 10 mg by mouth daily. AM   FISH OIL PO Take by mouth.   GLUCOSAMINE CHONDR COMPLEX PO Take by mouth.   MAGNESIUM PO Take by mouth.   mirabegron ER 50 MG Tb24 tablet Commonly known as: MYRBETRIQ Take 1 tablet (50 mg total) by mouth daily.   olmesartan 20 MG tablet Commonly known as: BENICAR Take 10 mg by mouth daily. AM   rosuvastatin 5 MG tablet Commonly known as: CRESTOR Take 5 mg by mouth daily.   TURMERIC CURCUMIN PO Take by mouth.   VITAMIN A PO Take by mouth.  VITAMIN B-12 PO Take by mouth.   Cyanocobalamin 1000 MCG Subl Take by mouth.   VITAMIN D PO Take by mouth.        Allergies:  Allergies  Allergen Reactions   Adhesive [Tape] Other (See Comments)    blisters   Lactose Diarrhea   Lactose Intolerance (Gi)    Metformin Hcl Diarrhea   Erythromycin Rash   Penicillins Rash    Family History: Family History  Problem Relation Age of Onset   Breast cancer Cousin        mat cousin    Social History:  reports that she has never smoked. She has never used smokeless tobacco. She reports that she does not drink alcohol and does not use drugs.  ROS:                                        Physical Exam: There were no vitals taken for this visit.  Constitutional:  Alert and oriented, No acute distress. HEENT: [place] AT, moist mucus membranes.  Trachea midline, no masses.  Laboratory  Data: No results found for: WBC, HGB, HCT, MCV, PLT  No results found for: CREATININE  No results found for: PSA  No results found for: TESTOSTERONE  No results found for: HGBA1C  Urinalysis    Component Value Date/Time   APPEARANCEUR Clear 12/11/2020 1354   GLUCOSEU Negative 12/11/2020 1354   BILIRUBINUR Negative 12/11/2020 1354   PROTEINUR Negative 12/11/2020 1354   NITRITE Negative 12/11/2020 1354   LEUKOCYTESUR Trace (A) 12/11/2020 1354    Pertinent Imaging:   Assessment & Plan: Patient improved on Myrbetriq.  I again explained about her small volume voiding with overactive bladder.  She tends to analyze her symptoms a lot.  $50 co-pay and she may try it every second day.  Concerned about side effects so we will stay on the Myrbetriq.  Reassess in 3 months   There are no diagnoses linked to this encounter.  No follow-ups on file.  Reece Packer, MD  [facility] [address], [place], [place] [address] [phone]

## 2021-04-23 ENCOUNTER — Telehealth: Payer: Self-pay | Admitting: Urology

## 2021-04-23 NOTE — Telephone Encounter (Signed)
Pt called in saying she feels she does not need her 11/28 appt with Dr. Matilde Sprang, but would like to know if he could call her in another prescription for the Myrbetriq. She would like a call back.

## 2021-04-24 MED ORDER — MIRABEGRON ER 50 MG PO TB24: 50.0000 mg | ORAL_TABLET | Freq: Every day | ORAL | 11 refills | Status: DC

## 2021-04-24 NOTE — Addendum Note (Signed)
Addended by: Alvera Novel on: 04/24/2021 02:17 PM   Modules accepted: Orders

## 2021-04-24 NOTE — Telephone Encounter (Signed)
Medication filled.  

## 2021-04-24 NOTE — Addendum Note (Signed)
Addended by: Alvera Novel on: 04/24/2021 01:12 PM   Modules accepted: Orders

## 2021-04-27 ENCOUNTER — Ambulatory Visit
Admission: EM | Admit: 2021-04-27 | Discharge: 2021-04-27 | Disposition: A | Discharge status: Home / Self Care | Payer: Medicare Other | Attending: Physician Assistant | Admitting: Physician Assistant

## 2021-04-27 ENCOUNTER — Ambulatory Visit
Admission: EM | Admit: 2021-04-27 | Discharge: 2021-04-27 | Discharge status: Against Medical Advice | Payer: Medicare Other

## 2021-04-27 ENCOUNTER — Ambulatory Visit (INDEPENDENT_AMBULATORY_CARE_PROVIDER_SITE_OTHER): Payer: Medicare Other

## 2021-04-27 ENCOUNTER — Other Ambulatory Visit: Payer: Self-pay

## 2021-04-27 DIAGNOSIS — S6702XA Crushing injury of left thumb, initial encounter: Secondary | ICD-10-CM | POA: Diagnosis not present

## 2021-04-27 DIAGNOSIS — S6010XA Contusion of unspecified finger with damage to nail, initial encounter: Secondary | ICD-10-CM

## 2021-04-27 NOTE — ED Triage Notes (Signed)
Patient presents to Urgent Care with complaints of finger injury yesterday. She states she caught her finger in her car door. Pain with palpation. Last night took a dose of tylenol.

## 2021-04-27 NOTE — Discharge Instructions (Signed)
-  The x-ray is normal. - You have a subungual hematoma or bleeding under the fingernail.  I have drained that today. - As we discussed, you have a tiny opening which puts you at risk for infection so it is important that you clean this area with soap and water and make sure it stays dry and covered with a Band-Aid for the next few days - Be sure to ice the finger and keep it elevated.  Tylenol for discomfort. - If you have any signs of infection such as increased redness or swelling from what it is now, red streak up the hand or wrist, fever, increased pain you should be seen again immediately.

## 2021-04-27 NOTE — ED Provider Notes (Signed)
[facility]    CSN: [id number] Arrival date & time: 04/27/21  1424      History   Chief Complaint Chief Complaint  Patient presents with   Finger Injury    HPI Dominique Spencer is a 80 y.o. female presenting for bruising under the left thumb since yesterday.  She says she smashed it in a car door.  She also reports that the thumb throbs at times.  It is warm to touch.  She has taken Tylenol but has not done anything else to treat condition.  Denies any weakness, numbness or tingling.  No other injuries.  No other complaints.  HPI  Past Medical History:  Diagnosis Date   Arthritis    hip   Back pain    SI joint   Chronic kidney disease    stage III   Hypertension    Motion sickness    boats   Skin cancer (melanoma) ([facility])    melanoma and squamous cell ca    Patient Active Problem List   Diagnosis Date Noted   Clinical depression 11/02/2014   Essential (primary) hypertension 11/02/2014   Malignant melanoma ([facility]) 11/02/2014   Hypercholesterolemia without hypertriglyceridemia 11/02/2014   Nausea with vomiting 11/02/2014   Diarrhea 11/02/2014   Hematochezia 11/02/2014   Chronic kidney disease (CKD), stage III (moderate) ([facility]) 02/20/2012   Has a tremor 11/04/2011    Past Surgical History:  Procedure Laterality Date   ABDOMINAL HYSTERECTOMY     ANTERIOR AND POSTERIOR REPAIR     BREAST BIOPSY Left    neg   COLONOSCOPY N/A 10/28/2014   Procedure: COLONOSCOPY;  Surgeon: Lucilla Lame, MD;  Location: [facility];  Service: Gastroenterology;  Laterality: N/A;   MELANOMA EXCISION      OB History   No obstetric history on file.      Home Medications    Prior to Admission medications   Medication Sig Start Date End Date Taking? Authorizing Provider  Calcium Carbonate-Vitamin D (CALCIUM 600-D PO) Take by mouth.    [provider]  Cholecalciferol (VITAMIN D PO) Take by mouth.    [provider]  citalopram (CELEXA) 10 MG tablet  Take 10 mg by mouth daily. AM    [provider]  Cyanocobalamin (VITAMIN B-12 PO) Take by mouth.    [provider]  Cyanocobalamin 1000 MCG SUBL Take by mouth.    [provider]  Glucosamine-Chondroitin (GLUCOSAMINE CHONDR COMPLEX PO) Take by mouth.    [provider]  MAGNESIUM PO Take by mouth.    [provider]  mirabegron ER (MYRBETRIQ) 50 MG TB24 tablet Take 1 tablet (50 mg total) by mouth daily. 04/24/21   Bjorn Loser, MD  olmesartan (BENICAR) 20 MG tablet Take 10 mg by mouth daily. AM    [provider]  Omega-3 Fatty Acids (FISH OIL PO) Take by mouth.    [provider]  rosuvastatin (CRESTOR) 5 MG tablet Take 5 mg by mouth daily. 09/22/20   [provider]  VITAMIN A PO Take by mouth.    [provider]    Family History Family History  Problem Relation Age of Onset   Breast cancer Cousin        mat cousin    Social History Social History   Tobacco Use   Smoking status: Never   Smokeless tobacco: Never  Substance Use Topics   Alcohol use: No   Drug use: No     Allergies  Adhesive [tape], Lactose, Lactose intolerance (gi), Metformin hcl, Erythromycin, and Penicillins   Review of Systems Review of Systems  Musculoskeletal:  Positive for arthralgias and joint swelling.  Skin:  Positive for color change.  Neurological:  Negative for weakness and numbness.    Physical Exam Triage Vital Signs ED Triage Vitals  Enc Vitals Group     BP 04/27/21 1601 (!) 158/64     Pulse Rate 04/27/21 1601 62     Resp 04/27/21 1601 16     Temp 04/27/21 1601 97.7 F (36.5 C)     Temp Source 04/27/21 1601 Oral     SpO2 04/27/21 1601 100 %     Weight --      Height --      Head Circumference --      Peak Flow --      Pain Score 04/27/21 1603 0     Pain Loc --      Pain Edu? --      Excl. in [place]? --    No data found.  Updated Vital Signs BP (!) 158/64 (BP Location: Right Arm)   Pulse  62   Temp 97.7 F (36.5 C) (Oral)   Resp 16   SpO2 100%       Physical Exam Vitals and nursing note reviewed.  Constitutional:      General: She is not in acute distress.    Appearance: Normal appearance. She is not ill-appearing or toxic-appearing.  HENT:     Head: Normocephalic and atraumatic.  Eyes:     General: No scleral icterus.       Right eye: No discharge.        Left eye: No discharge.     Conjunctiva/sclera: Conjunctivae normal.  Cardiovascular:     Rate and Rhythm: Normal rate.     Pulses: Normal pulses.  Pulmonary:     Effort: Pulmonary effort is normal. No respiratory distress.  Musculoskeletal:     Cervical back: Neck supple.     Comments: LEFT THUMB: There is a subungual hematoma of approximately 50% of the nail.  No lacerations of finger.  Distal finger is erythematous and warm.  Slightly decreased range of motion at the DIP.  TTP of DIP.  Good pulses, strength and sensation.  Skin:    General: Skin is dry.  Neurological:     General: No focal deficit present.     Mental Status: She is alert. Mental status is at baseline.     Motor: No weakness.     Gait: Gait normal.  Psychiatric:        Mood and Affect: Mood normal.        Behavior: Behavior normal.        Thought Content: Thought content normal.     UC Treatments / Results  Labs (all labs ordered are listed, but only abnormal results are displayed) Labs Reviewed - No data to display  EKG   Radiology DG Finger Thumb Left  Result Date: 04/27/2021 CLINICAL DATA:  Finger injury yesterday, caught thumb in car door EXAM: LEFT THUMB 2+V COMPARISON:  None FINDINGS: Osseous mineralization normal. Advanced degenerative changes of first [facility] joint with joint space narrowing, spur formation, and radial subluxation of first metacarpal. No acute fracture, dislocation, or bone destruction. IMPRESSION: No acute osseous abnormalities. 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 Electronically  Signed   By: Lavonia Dana M.D.   On: 04/27/2021 16:23    Procedures Procedures (including  critical care time)  Drainage of subungual hematoma: Patient requested and gave consent for drainage of subungual hematoma.  I cleaned the nail with alcohol swab and then used an 18-gauge needle to make a small hole and successfully drained nearly all of the blood from under the nail.  She reported improvement in the pain.  Cleaned area with alcohol swab and cover the Band-Aid.  Medications Ordered in UC Medications - No data to display  Initial Impression / Assessment and Plan / UC Course  I have reviewed the triage vital signs and the nursing notes.  Pertinent labs & imaging results that were available during my care of the patient were reviewed by me and considered in my medical decision making (see chart for details).  80 year old female presenting for a subungual hematoma of left thumb.  X-ray obtained today is not indicative of any fractures.  She does have degenerative changes.  Discussed results with patient.  She requested to have the blood drained so I did so with an 18-gauge needle.  Cleanse the nail and cover with bandage.  Discussed importance of good wound care and following up for any signs of infection or worsening condition.  Also reviewed icing the finger and continuing Tylenol for discomfort.   Final Clinical Impressions(s) / UC Diagnoses   Final diagnoses:  Subungual hematoma of digit of hand, initial encounter     Discharge Instructions      -The x-ray is normal. - You have a subungual hematoma or bleeding under the fingernail.  I have drained that today. - As we discussed, you have a tiny opening which puts you at risk for infection so it is important that you clean this area with soap and water and make sure it stays dry and covered with a Band-Aid for the next few days - Be sure to ice the finger and keep it elevated.  Tylenol for discomfort. - If you have any signs of  infection such as increased redness or swelling from what it is now, red streak up the hand or wrist, fever, increased pain you should be seen again immediately.   ED Prescriptions   None    PDMP not reviewed this encounter.   Danton Clap, PA-C 04/27/21 1709

## 2021-04-30 ENCOUNTER — Ambulatory Visit: Payer: Medicare Other | Admitting: Urology

## 2021-10-10 ENCOUNTER — Other Ambulatory Visit: Payer: Self-pay | Admitting: Nurse Practitioner

## 2021-10-10 DIAGNOSIS — Z1231 Encounter for screening mammogram for malignant neoplasm of breast: Secondary | ICD-10-CM

## 2021-10-16 ENCOUNTER — Ambulatory Visit
Admission: RE | Admit: 2021-10-16 | Discharge: 2021-10-16 | Disposition: A | Discharge status: Home / Self Care | Payer: Medicare Other | Source: Ambulatory Visit | Attending: Nurse Practitioner | Admitting: Nurse Practitioner

## 2021-10-16 DIAGNOSIS — Z1231 Encounter for screening mammogram for malignant neoplasm of breast: Secondary | ICD-10-CM | POA: Diagnosis not present

## 2021-10-19 ENCOUNTER — Other Ambulatory Visit: Payer: Self-pay | Admitting: Nurse Practitioner

## 2021-10-19 DIAGNOSIS — R928 Other abnormal and inconclusive findings on diagnostic imaging of breast: Secondary | ICD-10-CM

## 2021-10-19 DIAGNOSIS — N63 Unspecified lump in unspecified breast: Secondary | ICD-10-CM

## 2021-11-01 ENCOUNTER — Ambulatory Visit
Admission: RE | Admit: 2021-11-01 | Discharge: 2021-11-01 | Disposition: A | Discharge status: Home / Self Care | Payer: Medicare Other | Source: Ambulatory Visit | Attending: Nurse Practitioner | Admitting: Nurse Practitioner

## 2021-11-01 ENCOUNTER — Other Ambulatory Visit: Payer: Self-pay | Admitting: *Deleted

## 2021-11-01 DIAGNOSIS — N63 Unspecified lump in unspecified breast: Secondary | ICD-10-CM

## 2021-11-01 DIAGNOSIS — R928 Other abnormal and inconclusive findings on diagnostic imaging of breast: Secondary | ICD-10-CM

## 2022-05-02 ENCOUNTER — Other Ambulatory Visit: Payer: Self-pay

## 2022-05-08 ENCOUNTER — Encounter: Payer: Self-pay | Admitting: Gastroenterology

## 2022-05-08 ENCOUNTER — Ambulatory Visit: Payer: Medicare Other | Admitting: Gastroenterology

## 2022-05-08 VITALS — BP 131/77 | HR 75 | Temp 97.7°F | Ht 66.0 in | Wt 164.1 lb

## 2022-05-08 DIAGNOSIS — K580 Irritable bowel syndrome with diarrhea: Secondary | ICD-10-CM

## 2022-05-08 NOTE — Progress Notes (Signed)
Cephas Darby, MD [address]  [place]  [place], [place] [address]  Main: [phone]  Fax: [phone]    Gastroenterology Consultation  Referring Provider:     Romualdo Bolk, FNP Primary Care Physician:  Romualdo Bolk, FNP Primary Gastroenterologist:  Dr. Cephas Darby Reason for Consultation: Irritable bowel syndrome        HPI:   Dominique Spencer is a 81 y.o. female referred by Romualdo Bolk, FNP  for consultation & management of irritable bowel syndrome.  Patient reports that she has several years history of symptoms of increased bowel frequency.  She is diagnosed with irritable bowel syndrome, was advised to take Imodium as needed.  She has been taking Imodium half a pill daily which results in 1 bowel movement daily or every other day.  In August, she had not episode of fecal incontinence.  Patient reports that she was seeing a gastroenterologist in [place] several years ago.  Her weight has been stable.  She denies any abdominal pain, loss of appetite, abdominal bloating or rectal bleeding.  She does not have any active GI symptoms currently, wanted to establish care.  No evidence of anemia.  Patient is accompanied by her husband today  Patient does not smoke or drink alcohol  NSAIDs: None  Antiplts/Anticoagulants/Anti thrombotics: None  GI Procedures: Colonoscopy in 2016 which revealed hemorrhoids only  Past Medical History:  Diagnosis Date   Arthritis    hip   Back pain    SI joint   Chronic kidney disease    stage III   Hypertension    Motion sickness    boats   Skin cancer (melanoma) ([facility])    melanoma and squamous cell ca    Past Surgical History:  Procedure Laterality Date   ABDOMINAL HYSTERECTOMY     ANTERIOR AND POSTERIOR REPAIR     BREAST BIOPSY Left    neg   COLONOSCOPY N/A 10/28/2014   Procedure: COLONOSCOPY;  Surgeon: Lucilla Lame, MD;  Location: [facility];  Service: Gastroenterology;  Laterality: N/A;   MELANOMA  EXCISION       Current Outpatient Medications:    ALPRAZolam (XANAX) 0.25 MG tablet, Take 0.25 mg by mouth 2 (two) times daily as needed for anxiety., Disp: , Rfl:    Calcium Carbonate-Vitamin D (CALCIUM 600-D PO), Take by mouth., Disp: , Rfl:    Cholecalciferol (VITAMIN D PO), Take by mouth., Disp: , Rfl:    Cinnamon 500 MG TABS, Take by mouth., Disp: , Rfl:    Cyanocobalamin (VITAMIN B-12 PO), Take by mouth., Disp: , Rfl:    Cyanocobalamin 1000 MCG SUBL, Take by mouth., Disp: , Rfl:    desonide (DESOWEN) 0.05 % ointment, Apply 1 Application topically., Disp: , Rfl:    gabapentin (NEURONTIN) 300 MG capsule, Take 1 capsule by mouth at bedtime., Disp: , Rfl:    Ginger, Zingiber officinalis, (GINGER PO), Take by mouth., Disp: , Rfl:    Glucosamine-Chondroitin (GLUCOSAMINE CHONDR COMPLEX PO), Take by mouth., Disp: , Rfl:    MAGNESIUM PO, Take by mouth., Disp: , Rfl:    mirabegron ER (MYRBETRIQ) 50 MG TB24 tablet, Take 1 tablet (50 mg total) by mouth daily., Disp: 30 tablet, Rfl: 11   olmesartan (BENICAR) 20 MG tablet, Take 10 mg by mouth daily. AM, Disp: , Rfl:    Omega-3 Fatty Acids (FISH OIL PO), Take by mouth., Disp: , Rfl:    rosuvastatin (CRESTOR) 5 MG tablet, Take 5  mg by mouth daily., Disp: , Rfl:    Selenium Sulfide 2.25 % SHAM, SHAMPOO/LATHER DAILY AS NEEDED, THEN 2-3 TIMES A WEEK AS MAINTENANCE. LEAVE ON 5 MINS BEFORE RINSING, Disp: , Rfl:    traMADol (ULTRAM) 50 MG tablet, Take 1 to 2 every 6 hours prn pain, Disp: , Rfl:    VITAMIN A PO, Take by mouth., Disp: , Rfl:    Family History  Problem Relation Age of Onset   Breast cancer Cousin        mat cousin     Social History   Tobacco Use   Smoking status: Never   Smokeless tobacco: Never  Substance Use Topics   Alcohol use: No   Drug use: No    Allergies as of 05/08/2022 - Review Complete 05/08/2022  Allergen Reaction Noted   Adhesive [tape] Other (See Comments) 10/24/2014   Lactose Diarrhea 10/24/2014   Lactose  intolerance (gi)  10/24/2014   Metformin hcl Diarrhea 05/06/2019   Erythromycin Rash 10/24/2014   Penicillins Rash 10/24/2014    Review of Systems:    All systems reviewed and negative except where noted in HPI.   Physical Exam:  BP 131/77 (BP Location: Right Arm, Patient Position: Sitting, Cuff Size: Normal)   Pulse 75   Temp 97.7 F (36.5 C) (Oral)   Ht '5\' 6"'$  (1.676 m)   Wt 164 lb 2 oz (74.4 kg)   BMI 26.49 kg/m  No LMP recorded. Patient has had a hysterectomy.  General:   Alert,  Well-developed, well-nourished, pleasant and cooperative in NAD Head:  Normocephalic and atraumatic. Eyes:  Sclera clear, no icterus.   Conjunctiva pink. Ears:  Normal auditory acuity. Nose:  No deformity, discharge, or lesions. Mouth:  No deformity or lesions,oropharynx pink & moist. Neck:  Supple; no masses or thyromegaly. Lungs:  Respirations even and unlabored.  Clear throughout to auscultation.   No wheezes, crackles, or rhonchi. No acute distress. Heart:  Regular rate and rhythm; no murmurs, clicks, rubs, or gallops. Abdomen:  Normal bowel sounds. Soft, non-tender and non-distended without masses, hepatosplenomegaly or hernias noted.  No guarding or rebound tenderness.   Rectal: Not performed Msk:  Symmetrical without gross deformities. Good, equal movement & strength bilaterally. Pulses:  Normal pulses noted. Extremities:  No clubbing or edema.  No cyanosis. Neurologic:  Alert and oriented x3;  grossly normal neurologically. Skin:  Intact without significant lesions or rashes. No jaundice. Psych:  Alert and cooperative. Normal mood and affect.  Imaging Studies: No abdominal imaging  Assessment and Plan:   Dominique Spencer is a 81 y.o. female with history of hypertension, stage III CKD is seen in consultation for diarrhea predominant IBS currently managed with Imodium as needed and symptoms under control. Check celiac disease panel Discussed with patient regarding upper endoscopy and  colonoscopy with biopsies if she has recurrence of symptoms   Follow up as needed   Cephas Darby, MD

## 2022-05-09 LAB — CELIAC DISEASE PANEL
Endomysial IgA: NEGATIVE
IgA/Immunoglobulin A, Serum: 97 mg/dL (ref 64–422)
Transglutaminase IgA: <2 U/mL (ref 0–3)

## 2022-07-22 ENCOUNTER — Telehealth: Payer: Self-pay

## 2022-07-22 MED ORDER — OXYBUTYNIN CHLORIDE ER 10 MG PO TB24: 10.0000 mg | ORAL_TABLET | Freq: Every day | ORAL | 3 refills | Status: DC

## 2022-07-22 NOTE — Addendum Note (Signed)
Addended by: Kris Mouton on: 07/22/2022 04:15 PM   Modules accepted: Orders

## 2022-07-22 NOTE — Telephone Encounter (Signed)
Left message for patient to call back to discuss, RX sent in. She needed 90 day supply per last conversation.

## 2022-07-22 NOTE — Telephone Encounter (Signed)
Patient scheduled follow up in April, she is in [place] until around 09/21/22. Patient states Myrbetriq is expensive about $50 a month and wonders if there is something else that would be cheaper and work the same for her symptoms? If not she will have to hold on the RX and wait till she follows up.

## 2022-07-23 NOTE — Telephone Encounter (Signed)
Notified patient as instructed, Oxybutynin was sent in

## 2022-09-30 ENCOUNTER — Ambulatory Visit: Payer: Medicare Other | Admitting: Urology

## 2022-09-30 ENCOUNTER — Telehealth: Payer: Self-pay | Admitting: Urology

## 2022-09-30 NOTE — Telephone Encounter (Signed)
Pt called to r/s appt this afternoon, she is sick.  She switched from taking Myrbetriq to Oxybutnin and wants to know if dry mouth, no saliva could be a side effect from meds.

## 2022-10-14 ENCOUNTER — Ambulatory Visit: Payer: Medicare Other | Admitting: Urology

## 2022-10-14 ENCOUNTER — Encounter: Payer: Self-pay | Admitting: Urology

## 2022-10-14 VITALS — BP 126/82 | HR 67 | Ht 66.0 in | Wt 164.0 lb

## 2022-10-14 DIAGNOSIS — R32 Unspecified urinary incontinence: Secondary | ICD-10-CM

## 2022-10-14 DIAGNOSIS — N3941 Urge incontinence: Secondary | ICD-10-CM | POA: Diagnosis not present

## 2022-10-14 MED ORDER — MIRABEGRON ER 50 MG PO TB24: 50.0000 mg | ORAL_TABLET | Freq: Every day | ORAL | 3 refills | Status: DC

## 2022-10-14 NOTE — Progress Notes (Signed)
10/14/2022 9:08 AM   Dominique Spencer 1941/04/01 [id number]  Referring provider: Olena Leatherwood, FNP [address] [place],  [place] [address]  Chief Complaint  Patient presents with   Follow-up    1 year follow-up    HPI: I was consulted to assess the patient's urgency incontinence.  She denies stress incontinence.  Sometimes she may have a little bit of bedwetting.  She can wear 2-3 pads a day sometimes soaked sometimes damp   She voids every 2 hours and is time voiding.  She started to get up once or twice a night.  Flow was good  She had an anterior and posterior repair 40 years ago.  She has had a hysterectomy.  She may have had a second bladder procedure at the time of the hysterectomy   On pelvic examination patient had moderate atrophy.  She had a high small grade 2 cystocele with no stress incontinence     Patient having less urge incontinence and more time to go to the restroom.  Frequency less.  Voiding small amounts and we discussed this.    Cystoscopy: Normal  Patient improved on Myrbetriq.  I again explained about her small volume voiding with overactive bladder.  She tends to analyze her symptoms a lot.  $50 co-pay and she may try it every second day.  Concerned about side effects so we will stay on the Myrbetriq.  Reassess in 3 months   Today I have not seen the patient since 2022.  Dry mouth with oxybutynin and was concerned about central nervous system side effects.  She was go back on Myrbetriq and may take it once a day or every second day.  Clinically not infected.  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    Surgical History: Past Surgical History:  Procedure Laterality Date   ABDOMINAL HYSTERECTOMY     ANTERIOR AND POSTERIOR  REPAIR     BREAST BIOPSY Left    neg   COLONOSCOPY N/A 10/28/2014   Procedure: COLONOSCOPY;  Surgeon: Midge Minium, MD;  Location: [facility];  Service: Gastroenterology;  Laterality: N/A;   MELANOMA EXCISION      Home Medications:  Allergies as of 10/14/2022       Reactions   Adhesive [tape] Other (See Comments)   blisters   Lactose Diarrhea   Lactose Intolerance (gi)    Metformin Hcl Diarrhea   Erythromycin Rash   Penicillins Rash        Medication List        Accurate as of Oct 14, 2022  9:08 AM. If you have any questions, ask your nurse or doctor.          ALPRAZolam 0.25 MG tablet Commonly known as: XANAX Take 0.25 mg by mouth 2 (two) times daily as needed for anxiety.   CALCIUM 600-D PO Take by mouth.   Cinnamon 500 MG Tabs Take by mouth.   citalopram 10 MG tablet Commonly known as: CELEXA Take 10 mg by mouth daily.   desonide 0.05 % ointment Commonly known as: DESOWEN Apply 1 Application topically.   FISH OIL PO Take by mouth.  gabapentin 300 MG capsule Commonly known as: NEURONTIN Take 1 capsule by mouth at bedtime.   GINGER PO Take by mouth.   GLUCOSAMINE CHONDR COMPLEX PO Take by mouth.   MAGNESIUM PO Take by mouth.   olmesartan 20 MG tablet Commonly known as: BENICAR Take 10 mg by mouth daily. AM   oxybutynin 10 MG 24 hr tablet Commonly known as: DITROPAN-XL Take 1 tablet (10 mg total) by mouth daily.   rosuvastatin 5 MG tablet Commonly known as: CRESTOR Take 5 mg by mouth daily.   Selenium Sulfide 2.25 % Sham SHAMPOO/LATHER DAILY AS NEEDED, THEN 2-3 TIMES A WEEK AS MAINTENANCE. LEAVE ON 5 MINS BEFORE RINSING   traMADol 50 MG tablet Commonly known as: ULTRAM Take 1 to 2 every 6 hours prn pain   VITAMIN A PO Take by mouth.   VITAMIN B-12 PO Take by mouth.   Cyanocobalamin 1000 MCG Subl Take by mouth.   VITAMIN D PO Take by mouth.        Allergies:  Allergies  Allergen Reactions   Adhesive [Tape]  Other (See Comments)    blisters   Lactose Diarrhea   Lactose Intolerance (Gi)    Metformin Hcl Diarrhea   Erythromycin Rash   Penicillins Rash    Family History: Family History  Problem Relation Age of Onset   Breast cancer Cousin        mat cousin    Social History:  reports that she has never smoked. She has never been exposed to tobacco smoke. She has never used smokeless tobacco. She reports that she does not drink alcohol and does not use drugs.  ROS:                                        Physical Exam: There were no vitals taken for this visit.  Constitutional:  Alert and oriented, No acute distress. HEENT: [place] AT, moist mucus membranes.  Trachea midline, no masses.  Laboratory Data: No results found for: "WBC", "HGB", "HCT", "MCV", "PLT"  No results found for: "CREATININE"  No results found for: "PSA"  No results found for: "TESTOSTERONE"  No results found for: "HGBA1C"  Urinalysis    Component Value Date/Time   APPEARANCEUR Clear 01/22/2021 1400   GLUCOSEU Negative 01/22/2021 1400   BILIRUBINUR Negative 01/22/2021 1400   PROTEINUR Negative 01/22/2021 1400   NITRITE Negative 01/22/2021 1400   LEUKOCYTESUR Negative 01/22/2021 1400    Pertinent Imaging:   Assessment & Plan: 90 x 3 sent to pharmacy and I will see her in 1 year  There are no diagnoses linked to this encounter.  No follow-ups on file.  Martina Sinner, MD  [facility] [address] [place], [place] [address] [phone]

## 2022-10-15 LAB — MICROSCOPIC EXAMINATION

## 2022-10-15 LAB — URINALYSIS, COMPLETE
Bilirubin, UA: NEGATIVE
Glucose, UA: NEGATIVE
Ketones, UA: NEGATIVE
Leukocytes,UA: NEGATIVE
Nitrite, UA: NEGATIVE
Protein,UA: NEGATIVE
Specific Gravity, UA: 1.015 (ref 1.005–1.030)
Urobilinogen, Ur: 0.2 mg/dL (ref 0.2–1.0)
pH, UA: 6 (ref 5.0–7.5)

## 2022-10-22 ENCOUNTER — Other Ambulatory Visit: Payer: Self-pay | Admitting: Family Medicine

## 2022-10-22 DIAGNOSIS — Z780 Asymptomatic menopausal state: Secondary | ICD-10-CM

## 2022-11-05 ENCOUNTER — Other Ambulatory Visit: Payer: Self-pay | Admitting: Family Medicine

## 2022-11-05 DIAGNOSIS — Z1231 Encounter for screening mammogram for malignant neoplasm of breast: Secondary | ICD-10-CM

## 2022-11-25 ENCOUNTER — Ambulatory Visit
Admission: RE | Admit: 2022-11-25 | Discharge: 2022-11-25 | Disposition: A | Discharge status: Home / Self Care | Payer: Medicare Other | Source: Ambulatory Visit | Attending: Family Medicine | Admitting: Family Medicine

## 2022-11-25 DIAGNOSIS — Z1231 Encounter for screening mammogram for malignant neoplasm of breast: Secondary | ICD-10-CM | POA: Diagnosis present

## 2022-11-25 DIAGNOSIS — Z780 Asymptomatic menopausal state: Secondary | ICD-10-CM

## 2022-12-02 ENCOUNTER — Ambulatory Visit: Payer: Medicare Other | Admitting: Urology

## 2023-02-26 ENCOUNTER — Telehealth: Payer: Self-pay

## 2023-02-26 NOTE — Telephone Encounter (Signed)
-----   Message from Midge Minium sent at 02/24/2023  4:38 PM EDT ----- Regarding: RE: Fine with me. ----- Message ----- From: Rayann Heman, CMA Sent: 02/24/2023   4:37 PM EDT To: Toney Reil, MD; Midge Minium, MD  Pt called today requesting to switch to Dr. Servando Snare. Pt stated Dr. Servando Snare was recommended to her and she is requesting [place] to transfer her. Please review and okay the switch.   Thanks!

## 2023-02-26 NOTE — Telephone Encounter (Signed)
Called and made appointment for patient 04/10/2023

## 2023-02-26 NOTE — Telephone Encounter (Signed)
Patient left a message stating that she talk to some earlier this week about making a appointment with Dr. Servando Snare in [place] and they told her they would have to call her back and no one has called her back. Voicemail was left at 10:57am. Returned patient call and she states she would like to see Dr. Servando Snare because he is in [place] and her PCP recommended Dr. Servando Snare. She states she also thinks that Dr. Servando Snare did her colonoscopy on her. Informed he did do the colonoscopy on her in 2016 but she saw Dr. Allegra Lai in the office in 05/2022 so is now a [facility] patient. Informed her Dr. Allegra Lai see patients in [place] also. She states that since her PCP recommend him and he did the colonoscopy she would rather see him. She states she is now having diarrhea and a lot of gas. She states the Imodium is not working. She is okay if Dr. Servando Snare wants her to stay with Dr. Allegra Lai because she did like her but she was just going with her PCP recommendations. I also apologized to patient that no one had called her back asked her when she talk to someone and she said Monday and I asked who it was and she said she does not remember her name. I told her I am so sorry but I will get this message to the providers and call her back personally

## 2023-02-26 NOTE — Telephone Encounter (Signed)
-----   Message from [facility] sent at 02/24/2023  5:15 PM EDT ----- I am okay with the switch ----- Message ----- From: Rayann Heman, CMA Sent: 02/24/2023   4:37 PM EDT To: Toney Reil, MD; Midge Minium, MD  Pt called today requesting to switch to Dr. Servando Snare. Pt stated Dr. Servando Snare was recommended to her and she is requesting [place] to transfer her. Please review and okay the switch.   Thanks!

## 2023-04-10 ENCOUNTER — Encounter: Payer: Self-pay | Admitting: Gastroenterology

## 2023-04-10 ENCOUNTER — Ambulatory Visit: Payer: Medicare Other | Admitting: Gastroenterology

## 2023-04-10 VITALS — BP 177/79 | HR 65 | Temp 97.7°F | Wt 159.0 lb

## 2023-04-10 DIAGNOSIS — K580 Irritable bowel syndrome with diarrhea: Secondary | ICD-10-CM

## 2023-04-10 MED ORDER — VIBERZI 100 MG PO TABS: 1.0000 | ORAL_TABLET | Freq: Every day | ORAL | Status: DC

## 2023-04-10 NOTE — Progress Notes (Signed)
Primary Care Physician: Cleon Dew, FNP  Primary Gastroenterologist:  Dr. Midge Minium  Chief Complaint  Patient presents with   Diarrhea   Gas    HPI: Dominique Spencer is a 82 y.o. female here with a history of irritable bowel syndrome with diarrhea predominance.  The husband comes in stating that the patient has having more symptoms when she is stressed.  She has anxiety every day and he feels that that may be contributing to her problems.  The patient states that she had seen Dr. Allegra Spencer in December of last year but had a colonoscopy by me back in 2016 so decided to come back and see me.  She reports that her biggest issue now for the last few months has been a lot of gas.  She does not have any milk in her diet and she cannot pinpoint what is causing the amount of gas she is having.  There is no report of any unexplained weight loss fevers chills nausea or vomiting but she does report that she has a fear of eating.  She denies chewing gum, drinking with a straw, eating gassy foods or drinking carbonated drinks.  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     Calcium Carbonate-Vitamin D (CALCIUM 600-D PO) Take by mouth.     Cholecalciferol (VITAMIN D PO) Take by mouth.     Cinnamon 500 MG TABS Take by mouth.     citalopram (CELEXA) 10 MG tablet Take 10 mg by mouth daily.     Cyanocobalamin (VITAMIN B-12 PO) Take by mouth.     Cyanocobalamin 1000 MCG SUBL Take by mouth.     desonide (DESOWEN) 0.05 % ointment Apply 1 Application topically.     gabapentin (NEURONTIN) 300 MG capsule Take 1 capsule by mouth at bedtime.     Ginger,  Zingiber officinalis, (GINGER PO) Take by mouth.     Glucosamine-Chondroitin (GLUCOSAMINE CHONDR COMPLEX PO) Take by mouth.     MAGNESIUM PO Take by mouth.     mirabegron ER (MYRBETRIQ) 50 MG TB24 tablet Take 1 tablet (50 mg total) by mouth daily. 90 tablet 3   olmesartan (BENICAR) 20 MG tablet Take 10 mg by mouth daily. AM     Omega-3 Fatty Acids (FISH OIL PO) Take by mouth.     rosuvastatin (CRESTOR) 5 MG tablet Take 5 mg by mouth daily.     Selenium Sulfide 2.25 % SHAM SHAMPOO/LATHER DAILY AS NEEDED, THEN 2-3 TIMES A WEEK AS MAINTENANCE. LEAVE ON 5 MINS BEFORE RINSING     VITAMIN A PO Take by mouth.     No current facility-administered medications for this visit.    Allergies as of 04/10/2023 - Review Complete 04/10/2023  Allergen Reaction Noted   Adhesive [tape] Other (See Comments) 10/24/2014   Lactose Diarrhea 10/24/2014   Lactose intolerance (gi)  10/24/2014   Metformin hcl Diarrhea 05/06/2019   Erythromycin Rash 10/24/2014   Penicillins Rash 10/24/2014    ROS:  General: Negative for anorexia, weight  loss, fever, chills, fatigue, weakness. ENT: Negative for hoarseness, difficulty swallowing , nasal congestion. CV: Negative for chest pain, angina, palpitations, dyspnea on exertion, peripheral edema.  Respiratory: Negative for dyspnea at rest, dyspnea on exertion, cough, sputum, wheezing.  GI: See history of present illness. GU:  Negative for dysuria, hematuria, urinary incontinence, urinary frequency, nocturnal urination.  Endo: Negative for unusual weight change.    Physical Examination:   BP (!) 177/79 (BP Location: Right Arm, Patient Position: Sitting, Cuff Size: Normal)   Pulse 65   Temp 97.7 F (36.5 C) (Oral)   Wt 159 lb (72.1 kg)   BMI 25.66 kg/m   General: Well-nourished, well-developed in no acute distress.  Eyes: No icterus. Conjunctivae pink. Skin: Warm and dry, no jaundice.   Psych: Alert and cooperative, normal mood and affect.  Labs:    Imaging  Studies: No results found.  Assessment and Plan:   Dominique Spencer is a 82 y.o. y/o female who comes in today with a history of irritable bowel syndrome with diarrhea predominance.  The patient was helped with Imodium in the past but it is not helping as much as it had previously.  The patient will be started on Viberzi 100 mg to be taken every day.  The patient will also start a low FODMAP diet.  She has been explained the pathophysiology of burping air and passing flatulence.  She has been told to avoid anything that can stimulate paraphasia.  The patient will call me if the for Dennison Nancy is working and then we will get a prescription for it for her.  The patient and her husband have been explained the plan and agree with it.     Midge Minium, MD. Clementeen Graham    Note: This dictation was prepared with Dragon dictation along with smaller phrase technology. Any transcriptional errors that result from this process are unintentional.

## 2023-04-16 ENCOUNTER — Telehealth: Payer: Self-pay

## 2023-04-16 NOTE — Telephone Encounter (Signed)
Patient left a voicemail on the main line at 9:33am and states she saw Dr. Servando Snare on 04/10/2023 and he gave her samples of Viberzi. She is calling because she wants to know if this medication can be taken on a as needed because her symptoms are not every day she has flare ups. She states she does not want to take something every day.

## 2023-04-21 ENCOUNTER — Other Ambulatory Visit: Payer: Self-pay | Admitting: Gastroenterology

## 2023-04-21 NOTE — Addendum Note (Signed)
Addended by: Roena Malady on: 04/21/2023 12:41 PM   Modules accepted: Orders

## 2023-04-21 NOTE — Telephone Encounter (Signed)
The patient called in wanting to know if Dr.Whol sent her a prescription for Viberzi. She saw Dr. Servando Snare on 04/10/2023 and he gave her samples of Viberzi. The patient pharmacy is [facility] [address] [place], [place], [place] [address]. She has question about another medication. The patient want to know if she needs to take it as needed.

## 2023-04-22 MED ORDER — VIBERZI 100 MG PO TABS: 1.0000 | ORAL_TABLET | Freq: Every day | ORAL | 5 refills | Status: DC

## 2023-04-22 NOTE — Telephone Encounter (Signed)
Left message on voicemail.

## 2023-04-22 NOTE — Addendum Note (Signed)
Addended by: Roena Malady on: 04/22/2023 08:24 AM   Modules accepted: Orders

## 2023-04-23 ENCOUNTER — Telehealth: Payer: Self-pay | Admitting: Gastroenterology

## 2023-04-23 NOTE — Telephone Encounter (Signed)
Left message on voicemail.

## 2023-04-23 NOTE — Telephone Encounter (Signed)
Patient called in to get her medicine ( Viberzi) sent in [facility] on [address], [place], [place] [address]. She needs to speak with the nurse on how to take the medication.

## 2023-04-25 ENCOUNTER — Encounter: Payer: Self-pay | Admitting: Gastroenterology

## 2023-04-29 ENCOUNTER — Telehealth: Payer: Self-pay

## 2023-04-29 MED ORDER — VIBERZI 100 MG PO TABS: 1.0000 | ORAL_TABLET | Freq: Every day | ORAL | 5 refills | Status: DC

## 2023-04-29 NOTE — Telephone Encounter (Signed)
PA was submitted and has been approved   Request Reference Number: [id number]. VIBERZI TAB 100MG  is approved through 10/27/2023. Your patient may now fill this prescription and it will be covered.. Authorization Expiration Date: Oct 27, 2023.

## 2023-05-06 ENCOUNTER — Telehealth: Payer: Self-pay | Admitting: Urology

## 2023-05-06 NOTE — Telephone Encounter (Signed)
Patient called office and is requesting to change providers. She is wanting to become a patient of Dr. Apolinar Junes. She lives in [place] and doesn't want to travel to [place] any longer. She is wanting to have all of her providers in [place]. There are no complaints of care by Dr. Sherron Monday, it's simply due to location. Please advise if patient can change providers. Her next appointment is 10/13/23.

## 2023-05-06 NOTE — Telephone Encounter (Signed)
Ok to see Dominique Spencer or myself.    Vanna Scotland, MD

## 2023-05-23 ENCOUNTER — Encounter: Payer: Self-pay | Admitting: Urology

## 2023-10-02 ENCOUNTER — Other Ambulatory Visit: Payer: Self-pay | Admitting: Physician Assistant

## 2023-10-02 ENCOUNTER — Ambulatory Visit
Admission: RE | Admit: 2023-10-02 | Discharge: 2023-10-02 | Disposition: A | Discharge status: Home / Self Care | Source: Ambulatory Visit | Attending: Physician Assistant | Admitting: Physician Assistant

## 2023-10-02 DIAGNOSIS — M25561 Pain in right knee: Secondary | ICD-10-CM | POA: Diagnosis present

## 2023-10-10 ENCOUNTER — Other Ambulatory Visit: Payer: Self-pay | Admitting: Urology

## 2023-10-10 ENCOUNTER — Ambulatory Visit: Payer: Self-pay | Admitting: Urology

## 2023-10-10 DIAGNOSIS — R32 Unspecified urinary incontinence: Secondary | ICD-10-CM | POA: Diagnosis not present

## 2023-10-10 MED ORDER — MIRABEGRON ER 50 MG PO TB24: 50.0000 mg | ORAL_TABLET | Freq: Every day | ORAL | 3 refills | Status: AC

## 2023-10-10 NOTE — Patient Instructions (Signed)
 Botulinum Toxin Bladder Injection  A botulinum toxin bladder injection is a procedure to treat an overactive bladder. During the procedure, a drug called botulinum toxin is injected into the bladder through a long, thin needle. This drug relaxes the bladder muscles and reduces overactivity. You may need this procedure if your medicines are not working or you cannot take them. The procedure may be repeated as needed. The treatment is done once and it usually lasts for 6 months. Your health care provider will monitor you to see how well you respond. Tell a health care provider about: Any allergies you have. All medicines you are taking, including vitamins, herbs, eye drops, creams, and over-the-counter medicines. Any problems you or family members have had with anesthetic medicines. Any bleeding problems you have. Any surgeries you have had. Any medical conditions you have. Any previous reactions to a botulinum toxin injection. Any symptoms of urinary tract infection. These include chills, fever, a burning feeling when passing urine, and needing to pass urine often. Whether you are pregnant or may be pregnant. What are the risks? Generally this is a safe procedure. However, problems may occur, including: Not being able to pass urine. If this happens, you may need to have your bladder emptied with a thin tube (urinary catheter). Bleeding. Urinary tract infection. Allergic reaction to the botulinum toxin. Pain or burning when passing urine. Damage to nearby structures or organs. What happens before the procedure? When to stop eating and drinking Follow instructions from your health care provider about what you may eat and drink before your procedure. These may include: 8 hours before the procedure Stop eating most foods. Do not eat meat, fried foods, or fatty foods. Eat only light foods, such as toast or crackers. All liquids are okay except energy drinks and alcohol. 6 hours before the  procedure Stop eating. Drink only clear liquids, such as water, clear fruit juice, black coffee, plain tea, and sports drinks. Do not drink energy drinks or alcohol. 2 hours before the procedure Stop drinking all liquids. You may be allowed to take medicines with small sips of water. If you do not follow your health care provider's instructions, your procedure may be delayed or canceled. Medicines Ask your health care provider about: Changing or stopping your regular medicines. This is especially important if you are taking diabetes medicines or blood thinners. Taking medicines such as aspirin and ibuprofen. These medicines can thin your blood. Do not take these medicines unless your health care provider tells you to take them. Taking over-the-counter medicines, vitamins, herbs, and supplements. General instructions Ask your health care provider what steps will be taken to help prevent infection. These steps may include: Removing hair at the procedure site. Washing skin with a germ-killing soap. Taking antibiotic medicine. If you will be going home right after the procedure, plan to have a responsible adult: Take you home from the hospital or clinic. You will not be allowed to drive. Care for you for the time you are told. What happens during the procedure?  You will be asked to empty your bladder. An IV will be inserted into one of your veins. You will be given one or more of the following: A medicine to help you relax (sedative). A medicine to numb the area (local anesthetic). A medicine to make you fall asleep (general anesthetic). A long, thin scope called a cystoscope will be passed into your bladder through the part of the body that carries urine from your bladder (urethra). The cystoscope  will be used to fill your bladder with water. A long needle will be passed through the cystoscope and into the bladder. The botulinum toxin will be injected into your bladder. It may be  injected into multiple areas of your bladder. The cystoscope will be removed and your bladder will be emptied with a urinary catheter. The procedure may vary among health care providers and hospitals. What can I expect after the procedure? After your procedure, it is common to have: Blood-tinged urine. Burning or soreness when you pass urine. Follow these instructions at home: Medicines Take over-the-counter and prescription medicines only as told by your health care provider. If you were prescribed an antibiotic medicine, take it as told by your health care provider. Do not stop using the antibiotic even if you start to feel better. General instructions  If you were given a sedative during the procedure, it can affect you for several hours. Do not drive or operate machinery until your health care provider says that it is safe. Drink enough fluid to keep your urine pale yellow. Return to your normal activities as told by your health care provider. Ask your health care provider what activities are safe for you. Keep all follow-up visits. Contact a health care provider if you have: A fever or chills. Blood-tinged urine for more than one day after your procedure. Worsening pain or burning when you pass urine. Pain or burning when passing urine for more than two days after your procedure. Trouble emptying your bladder. Get help right away if you: Have bright red blood in your urine. Are unable to pass urine. Summary A botulinum toxin bladder injection is a procedure to treat an overactive bladder. This is generally a safe procedure. However, problems may occur, including not being able to pass urine, bleeding, infection, pain, and an allergic reaction to the botulinum toxin. You will be told when to stop eating and drinking, and what medicines to change or stop. Follow instructions carefully. After the procedure, it is common to have blood in your urine and to have soreness or burning when  passing urine. Contact a health care provider if you have a fever, blood in your urine for more than a few days, or trouble passing urine. Get help right away if you have bright red blood in your urine, or if you are unable to pass urine. This information is not intended to replace advice given to you by your health care provider. Make sure you discuss any questions you have with your health care provider. Document Revised: 11/24/2020 Document Reviewed: 11/24/2020 Elsevier Patient Education  2024 [employer].

## 2023-10-10 NOTE — Progress Notes (Signed)
 Elfrieda Grise Plume,acting as a scribe for Dustin Gimenez, MD.,have documented all relevant documentation on the behalf of Dustin Gimenez, MD,as directed by  Dustin Gimenez, MD while in the presence of Dustin Gimenez, MD.  10/10/23 2:46 PM   Dominique Spencer December 12, 1940 [id number]  Referring provider: Mai Schwalbe, FNP [address] [place],  [place] [address]  Chief Complaint  Patient presents with   Urinary Incontinence    HPI:  83 year old female with a personal history of urinary incontinence, primarily urge incontinence, presents today for follow-up. She was previous followed by Dr. Clarke Crouch.  Please see previous notes for details. She has a grade 2 cystocele. She had a cystoscopy that was unremarkable.   She has been managed on Myrbetriq  but admits to not taking it consistently due to being on multiple medications.   She experiences frequent episodes of incontinence daily, particularly when she feels the urge to urinate, and is concerned about the effectiveness of her current medication. She inquires about alternative treatments, including newer medications like Gemtesa, which may be cost-prohibitive, and Botox injections for the bladder.   She has previously tried other medications that caused dry eyes and dry mouth without significant improvement.   She expresses a desire to manage her condition more effectively due to the inconvenience it causes in her daily life.  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    Surgical History: Past Surgical History:  Procedure Laterality Date   ABDOMINAL HYSTERECTOMY     ANTERIOR AND POSTERIOR REPAIR     BREAST BIOPSY Left    neg   COLONOSCOPY N/A 10/28/2014   Procedure: COLONOSCOPY;  Surgeon: Marnee Sink, MD;  Location: [facility];   Service: Gastroenterology;  Laterality: N/A;   MELANOMA EXCISION      Home Medications:  Allergies as of 10/10/2023       Reactions   Lactose Diarrhea   Lactose Intolerance (gi)    Metformin Hcl Diarrhea   Silicone Other (See Comments)   silicones   Adhesive [tape] Rash   blisters   Erythromycin Rash   Penicillins Rash        Medication List        Accurate as of Oct 10, 2023  2:46 PM. If you have any questions, ask your nurse or doctor.          STOP taking these medications    Selenium Sulfide 2.25 % Sham Stopped by: Dustin Gimenez   Viberzi  100 MG Tabs Generic drug: Eluxadoline  Stopped by: Dustin Gimenez   VITAMIN B-12 PO Stopped by: Dustin Gimenez       TAKE these medications    ALPRAZolam 0.25 MG tablet Commonly known as: XANAX Take 0.25 mg by mouth 2 (two) times daily as needed for anxiety.   CALCIUM 600-D PO Take by mouth.   Cinnamon 500 MG Tabs Take by mouth.   citalopram 10 MG tablet Commonly known as: CELEXA Take 10 mg by mouth daily.   desonide 0.05 % ointment Commonly known as: DESOWEN Apply 1 Application topically. As needed   FISH OIL PO Take by mouth.   gabapentin 300 MG capsule Commonly known as: NEURONTIN Take 1  capsule by mouth at bedtime.   GINGER PO Take by mouth.   GLUCOSAMINE CHONDR COMPLEX PO Take by mouth.   MAGNESIUM PO Take by mouth.   mirabegron  ER 50 MG Tb24 tablet Commonly known as: MYRBETRIQ  Take 1 tablet (50 mg total) by mouth daily.   olmesartan 20 MG tablet Commonly known as: BENICAR Take 10 mg by mouth daily. AM   rosuvastatin 5 MG tablet Commonly known as: CRESTOR Take 5 mg by mouth daily.   traMADol 50 MG tablet Commonly known as: ULTRAM Take by mouth every 6 (six) hours as needed.   VITAMIN A PO Take by mouth.   VITAMIN D PO Take by mouth.        Allergies:  Allergies  Allergen Reactions   Lactose Diarrhea   Lactose Intolerance (Gi)    Metformin Hcl Diarrhea   Silicone  Other (See Comments)    silicones   Adhesive [Tape] Rash    blisters   Erythromycin Rash   Penicillins Rash    Family History: Family History  Problem Relation Age of Onset   Breast cancer Cousin        mat cousin    Social History:  reports that she has never smoked. She has never been exposed to tobacco smoke. She has never used smokeless tobacco. She reports that she does not drink alcohol and does not use drugs.   Physical Exam: BP 125/68 (BP Location: Left Arm, Patient Position: Sitting, Cuff Size: Normal)   Pulse 75   Ht 5\' 6"  (1.676 m)   Wt 156 lb (70.8 kg)   SpO2 98%   BMI 25.18 kg/m   Constitutional:  Alert and oriented, No acute distress. HEENT: [place] AT, moist mucus membranes.  Trachea midline, no masses. Neurologic: Grossly intact, no focal deficits, moving all 4 extremities. Psychiatric: Normal mood and affect.   Assessment & Plan:    1. Urge Urinary Incontinence - She is currently on Mirabegron  but has not been taking it consistently. Advised to take Mirabegron  daily to maintain therapeutic blood levels and improve efficacy. - Discussed alternative medication, Gemtesa, but noted its high cost. Will provide samples if available for trial. - Discussed the option of Botox injections in the bladder as a potential treatment if medication adjustments are ineffective. Provided information about the procedure, including potential risks and benefits. - Recommended follow-up if symptoms persist despite consistent medication use to discuss further interventions, including Botox.  Return in about 3 months (around 01/10/2024) for follow up or sooner if symptoms do not improve with consistent medication use.  I have reviewed the above documentation for accuracy and completeness, and I agree with the above.   Dustin Gimenez, MD  [facility] [address] [place], [place] [address] [phone]

## 2023-10-10 NOTE — Telephone Encounter (Signed)
 All Pharmacy Suggested Alternatives:  oxybutynin  (DITROPAN  XL) 15 MG 24 hr tablet solifenacin (VESICARE) 10 MG tablet tolterodine (DETROL) 2 MG tablet trospium (SANCTURA) 20 MG tablet

## 2023-10-13 ENCOUNTER — Ambulatory Visit: Payer: Medicare Other | Admitting: Urology

## 2023-12-15 ENCOUNTER — Other Ambulatory Visit: Payer: Self-pay | Admitting: Family Medicine

## 2023-12-15 DIAGNOSIS — Z1231 Encounter for screening mammogram for malignant neoplasm of breast: Secondary | ICD-10-CM

## 2024-01-14 ENCOUNTER — Ambulatory Visit
Admission: RE | Admit: 2024-01-14 | Discharge: 2024-01-14 | Disposition: A | Discharge status: Home / Self Care | Source: Ambulatory Visit | Attending: Family Medicine | Admitting: Family Medicine

## 2024-01-14 DIAGNOSIS — Z1231 Encounter for screening mammogram for malignant neoplasm of breast: Secondary | ICD-10-CM | POA: Diagnosis present

## 2024-01-15 ENCOUNTER — Inpatient Hospital Stay: Admission: RE | Admit: 2024-01-15 | Source: Ambulatory Visit

## 2024-01-28 ENCOUNTER — Encounter: Payer: Self-pay | Admitting: Gastroenterology

## 2024-01-29 ENCOUNTER — Ambulatory Visit: Admitting: Anesthesiology

## 2024-01-29 ENCOUNTER — Encounter
Admission: RE | Disposition: A | Discharge status: Home / Self Care | Payer: Self-pay | Source: Home / Self Care | Attending: Gastroenterology

## 2024-01-29 ENCOUNTER — Other Ambulatory Visit: Payer: Self-pay

## 2024-01-29 ENCOUNTER — Encounter: Payer: Self-pay | Admitting: Gastroenterology

## 2024-01-29 ENCOUNTER — Ambulatory Visit
Admission: RE | Admit: 2024-01-29 | Discharge: 2024-01-29 | Disposition: A | Discharge status: Home / Self Care | Attending: Gastroenterology | Admitting: Gastroenterology

## 2024-01-29 DIAGNOSIS — F419 Anxiety disorder, unspecified: Secondary | ICD-10-CM | POA: Insufficient documentation

## 2024-01-29 DIAGNOSIS — K52832 Lymphocytic colitis: Secondary | ICD-10-CM | POA: Diagnosis not present

## 2024-01-29 DIAGNOSIS — N183 Chronic kidney disease, stage 3 unspecified: Secondary | ICD-10-CM | POA: Insufficient documentation

## 2024-01-29 DIAGNOSIS — K529 Noninfective gastroenteritis and colitis, unspecified: Secondary | ICD-10-CM | POA: Diagnosis present

## 2024-01-29 DIAGNOSIS — I129 Hypertensive chronic kidney disease with stage 1 through stage 4 chronic kidney disease, or unspecified chronic kidney disease: Secondary | ICD-10-CM | POA: Diagnosis not present

## 2024-01-29 DIAGNOSIS — F32A Depression, unspecified: Secondary | ICD-10-CM | POA: Insufficient documentation

## 2024-01-29 DIAGNOSIS — Z79899 Other long term (current) drug therapy: Secondary | ICD-10-CM | POA: Insufficient documentation

## 2024-01-29 DIAGNOSIS — G709 Myoneural disorder, unspecified: Secondary | ICD-10-CM | POA: Insufficient documentation

## 2024-01-29 DIAGNOSIS — M1990 Unspecified osteoarthritis, unspecified site: Secondary | ICD-10-CM | POA: Insufficient documentation

## 2024-01-29 HISTORY — PX: COLONOSCOPY: SHX5424

## 2024-01-29 HISTORY — DX: Irritable bowel syndrome, unspecified: K58.9

## 2024-01-29 HISTORY — DX: Anxiety disorder, unspecified: F41.9

## 2024-01-29 HISTORY — DX: Radiculopathy, lumbar region: M54.16

## 2024-01-29 HISTORY — DX: Polyneuropathy, unspecified: G62.9

## 2024-01-29 SURGERY — COLONOSCOPY
Anesthesia: General

## 2024-01-29 MED ORDER — PROPOFOL 10 MG/ML IV BOLUS
INTRAVENOUS | Status: DC | PRN
Start: 2024-01-29 — End: 2024-01-29
  Administered 2024-01-29: 50 mg via INTRAVENOUS

## 2024-01-29 MED ORDER — SODIUM CHLORIDE 0.9 % IV SOLN: INTRAVENOUS | Status: DC

## 2024-01-29 MED ORDER — PROPOFOL 500 MG/50ML IV EMUL
INTRAVENOUS | Status: DC | PRN
  Administered 2024-01-29: 165 ug/kg/min via INTRAVENOUS

## 2024-01-29 MED ORDER — GLYCOPYRROLATE 0.2 MG/ML IJ SOLN: INTRAMUSCULAR | Status: DC | PRN

## 2024-01-29 MED ORDER — LIDOCAINE HCL (CARDIAC) PF 100 MG/5ML IV SOSY
PREFILLED_SYRINGE | INTRAVENOUS | Status: DC | PRN
  Administered 2024-01-29: 100 mg via INTRAVENOUS

## 2024-01-29 MED ORDER — ESMOLOL HCL 100 MG/10ML IV SOLN
INTRAVENOUS | Status: DC | PRN
  Administered 2024-01-29: 20 mg via INTRAVENOUS

## 2024-01-29 NOTE — Anesthesia Preprocedure Evaluation (Signed)
 Anesthesia Evaluation  Patient identified by MRN, date of birth, ID band Patient awake    Reviewed: Allergy & Precautions, NPO status , Patient's Chart, lab work & pertinent test results  History of Anesthesia Complications Negative for: history of anesthetic complications  Airway Mallampati: III  TM Distance: >3 FB Neck ROM: full    Dental  (+) Chipped   Pulmonary neg pulmonary ROS   Pulmonary exam normal        Cardiovascular hypertension, On Medications negative cardio ROS Normal cardiovascular exam     Neuro/Psych  PSYCHIATRIC DISORDERS Anxiety Depression     Neuromuscular disease    GI/Hepatic negative GI ROS, Neg liver ROS,,,  Endo/Other  negative endocrine ROS    Renal/GU Renal disease  negative genitourinary   Musculoskeletal  (+) Arthritis ,    Abdominal   Peds  Hematology negative hematology ROS (+)   Anesthesia Other Findings Past Medical History: No date: Anxiety No date: Arthritis     Comment:  hip No date: Back pain     Comment:  SI joint No date: Chronic kidney disease     Comment:  stage III No date: Hypertension No date: Irritable bowel syndrome No date: Lumbar radiculopathy No date: Motion sickness     Comment:  boats No date: Neuropathy No date: Skin cancer (melanoma) (HCC)     Comment:  melanoma and squamous cell ca  Past Surgical History: No date: ABDOMINAL HYSTERECTOMY No date: ANTERIOR AND POSTERIOR REPAIR No date: BREAST BIOPSY; Left     Comment:  neg 10/28/2014: COLONOSCOPY; N/A     Comment:  Procedure: COLONOSCOPY;  Surgeon: Rogelia Copping, MD;                Location: [facility];  Service:               Gastroenterology;  Laterality: N/A; No date: MELANOMA EXCISION     Reproductive/Obstetrics negative OB ROS                              Anesthesia Physical Anesthesia Plan  ASA: 3  Anesthesia Plan: General   Post-op Pain  Management: Minimal or no pain anticipated   Induction: Intravenous  PONV Risk Score and Plan: 2 and Propofol  infusion and TIVA  Airway Management Planned: Natural Airway and Nasal Cannula  Additional Equipment:   Intra-op Plan:   Post-operative Plan:   Informed Consent: I have reviewed the patients History and Physical, chart, labs and discussed the procedure including the risks, benefits and alternatives for the proposed anesthesia with the patient or authorized representative who has indicated his/her understanding and acceptance.     Dental Advisory Given  Plan Discussed with: Anesthesiologist, CRNA and Surgeon  Anesthesia Plan Comments: (Patient consented for risks of anesthesia including but not limited to:  - adverse reactions to medications - risk of airway placement if required - damage to eyes, teeth, lips or other oral mucosa - nerve damage due to positioning  - sore throat or hoarseness - Damage to heart, brain, nerves, lungs, other parts of body or loss of life  Patient voiced understanding and assent.)        Anesthesia Quick Evaluation

## 2024-01-29 NOTE — Anesthesia Procedure Notes (Addendum)
 Procedure Name: General with mask airway Date/Time: 01/29/2024 10:03 AM  Performed by: Ledora Duncan, CRNAPre-anesthesia Checklist: Patient identified, Emergency Drugs available, Suction available and Patient being monitored Patient Re-evaluated:Patient Re-evaluated prior to induction Oxygen Delivery Method: Simple face mask Induction Type: IV induction Placement Confirmation: positive ETCO2 and breath sounds checked- equal and bilateral Dental Injury: Teeth and Oropharynx as per pre-operative assessment

## 2024-01-29 NOTE — Op Note (Signed)
 [facility] Gastroenterology Patient Name: Dominique Spencer Procedure Date: 01/29/2024 9:53 AM MRN: [id number] Account #: [id number] Date of Birth: Sep 21, 1940 Admit Type: Outpatient Age: 83 Room: [facility] ENDO ROOM 2 Gender: Female Note Status: Finalized Instrument Name: Peds Colonoscope [id number] Procedure:             Colonoscopy Indications:           Chronic diarrhea, Clinically significant diarrhea of                         unexplained origin Providers:             Corinn Jess Brooklyn MD, MD Referring MD:          Lora Odor Medicines:             General Anesthesia Complications:         No immediate complications. Estimated blood loss: None. Procedure:             Pre-Anesthesia Assessment:                        - Prior to the procedure, a History and Physical was                         performed, and patient medications and allergies were                         reviewed. The patient is competent. The risks and                         benefits of the procedure and the sedation options and                         risks were discussed with the patient. All questions                         were answered and informed consent was obtained.                         Patient identification and proposed procedure were                         verified by the physician, the nurse, the                         anesthesiologist, the anesthetist and the technician                         in the pre-procedure area in the procedure room in the                         endoscopy suite. Mental Status Examination: alert and                         oriented. Airway Examination: normal oropharyngeal                         airway and neck mobility. Respiratory Examination:  clear to auscultation. CV Examination: normal.                         Prophylactic Antibiotics: The patient does not require                         prophylactic antibiotics. Prior  Anticoagulants: The                         patient has taken no anticoagulant or antiplatelet                         agents. ASA Grade Assessment: III - A patient with                         severe systemic disease. After reviewing the risks and                         benefits, the patient was deemed in satisfactory                         condition to undergo the procedure. The anesthesia                         plan was to use general anesthesia. Immediately prior                         to administration of medications, the patient was                         re-assessed for adequacy to receive sedatives. The                         heart rate, respiratory rate, oxygen saturations,                         blood pressure, adequacy of pulmonary ventilation, and                         response to care were monitored throughout the                         procedure. The physical status of the patient was                         re-assessed after the procedure.                        After obtaining informed consent, the colonoscope was                         passed under direct vision. Throughout the procedure,                         the patient's blood pressure, pulse, and oxygen                         saturations were monitored continuously. The  Colonoscope was introduced through the anus and                         advanced to the 20 cm into the ileum. The colonoscopy                         was performed without difficulty. The patient                         tolerated the procedure well. The quality of the bowel                         preparation was evaluated using the BBPS [facility] Bowel                         Preparation Scale) with scores of: Right Colon = 3,                         Transverse Colon = 3 and Left Colon = 3 (entire mucosa                         seen well with no residual staining, small fragments                         of stool or  opaque liquid). The total BBPS score                         equals 9. The terminal ileum, ileocecal valve,                         appendiceal orifice, and rectum were photographed. Findings:      The perianal and digital rectal examinations were normal. Pertinent       negatives include normal sphincter tone and no palpable rectal lesions.      The terminal ileum appeared normal. Biopsies were taken with a cold       forceps for histology.      Normal mucosa was found in the left colon and in the right colon.       Biopsies were taken with a cold forceps for histology.      The retroflexed view of the distal rectum and anal verge was normal and       showed no anal or rectal abnormalities. Impression:            - The examined portion of the ileum was normal.                         Biopsied.                        - Normal mucosa in the left colon and in the right                         colon. Biopsied.                        - The distal rectum and anal verge are normal on  retroflexion view. Recommendation:        - Discharge patient to home (with escort).                        - Resume previous diet today.                        - Continue present medications.                        - Await pathology results. Procedure Code(s):     --- Professional ---                        [phone], Colonoscopy, flexible; with biopsy, single or                         multiple Diagnosis Code(s):     --- Professional ---                        K52.9, Noninfective gastroenteritis and colitis,                         unspecified                        R19.7, Diarrhea, unspecified CPT copyright 2022 American Medical Association. All rights reserved. The codes documented in this report are preliminary and upon coder review may  be revised to meet current compliance requirements. Dr. Corinn Brooklyn Corinn Jess Brooklyn MD, MD 01/29/2024 89:71:45 AM This report has been signed  electronically. Number of Addenda: 0 Note Initiated On: 01/29/2024 9:53 AM Scope Withdrawal Time: 0 hours 16 minutes 37 seconds  Total Procedure Duration: 0 hours 21 minutes 5 seconds  Estimated Blood Loss:  Estimated blood loss: none.      [facility]

## 2024-01-29 NOTE — Transfer of Care (Signed)
 Immediate Anesthesia Transfer of Care Note  Patient: Dominique Spencer  Procedure(s) Performed: COLONOSCOPY  Patient Location: Endoscopy Unit  Anesthesia Type:General  Level of Consciousness: drowsy and patient cooperative  Airway & Oxygen Therapy: Patient Spontanous Breathing and Patient connected to face mask oxygen  Post-op Assessment: Report given to RN and Post -op Vital signs reviewed and stable  Post vital signs: Reviewed and stable  Last Vitals:  Vitals Value Taken Time  BP 122/46 01/29/24 10:29  Temp 35.9 C 01/29/24 10:29  Pulse 64 01/29/24 10:29  Resp 16 01/29/24 10:29  SpO2 100 % 01/29/24 10:29    Last Pain:  Vitals:   01/29/24 1029  TempSrc: Tympanic  PainSc: Asleep         Complications: No notable events documented.

## 2024-01-29 NOTE — Anesthesia Postprocedure Evaluation (Signed)
 Anesthesia Post Note  Patient: Dominique Spencer  Procedure(s) Performed: COLONOSCOPY  Patient location during evaluation: Endoscopy Anesthesia Type: General Level of consciousness: awake and alert Pain management: pain level controlled Vital Signs Assessment: post-procedure vital signs reviewed and stable Respiratory status: spontaneous breathing, nonlabored ventilation, respiratory function stable and patient connected to nasal cannula oxygen Cardiovascular status: blood pressure returned to baseline and stable Postop Assessment: no apparent nausea or vomiting Anesthetic complications: no   No notable events documented.   Last Vitals:  Vitals:   01/29/24 1037 01/29/24 1039  BP: (!) 128/57 (!) 160/61  Pulse: 63 65  Resp: 16 16  Temp:    SpO2: 100% 100%    Last Pain:  Vitals:   01/29/24 1039  TempSrc:   PainSc: 0-No pain                 Lendia LITTIE Mae

## 2024-01-29 NOTE — H&P (Signed)
 Corinn JONELLE Brooklyn, MD [facility], DHIP [address]  [place], [place] [address]  Main: [phone] Fax:  [phone] Pager: [phone]   Primary Care Physician:  Lora Odor, FNP Primary Gastroenterologist:  Dr. Corinn JONELLE Brooklyn  Pre-Procedure History & Physical: HPI:  Dominique Spencer is a 83 y.o. female is here for an colonoscopy.   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 Authorizing Provider  ALPRAZolam (XANAX) 0.25 MG tablet Take 0.25 mg by mouth 2 (two) times daily as needed for anxiety.    [provider]  Calcium Carbonate-Vitamin D (CALCIUM 600-D PO) Take by mouth.    [provider]  Cholecalciferol (VITAMIN D PO) Take by mouth.    [provider]  Cinnamon 500 MG TABS Take by mouth.    [provider]  citalopram (CELEXA) 10 MG tablet Take 10 mg by mouth daily. 09/12/22   [provider]  desonide (DESOWEN) 0.05 % ointment Apply 1 Application topically. As needed    [provider]  gabapentin (NEURONTIN) 300 MG capsule Take 1 capsule by mouth at bedtime. 11/27/21 04/10/23  [provider]  Ginger, Zingiber officinalis, (GINGER PO) Take by mouth.    [provider]  Glucosamine-Chondroitin  (GLUCOSAMINE CHONDR COMPLEX PO) Take by mouth.    [provider]  MAGNESIUM PO Take by mouth.    [provider]  mirabegron  ER (MYRBETRIQ ) 50 MG TB24 tablet Take 1 tablet (50 mg total) by mouth daily. 10/10/23   Penne Knee, MD  olmesartan (BENICAR) 20 MG tablet Take 10 mg by mouth daily. AM    [provider]  Omega-3 Fatty Acids (FISH OIL PO) Take by mouth.    [provider]  rosuvastatin (CRESTOR) 5 MG tablet Take 5 mg by mouth daily. 09/22/20   [provider]  traMADol (ULTRAM) 50 MG tablet Take by mouth every 6 (six) hours as needed. 10/08/23   [provider]  VITAMIN A PO Take by mouth.    [provider]    Allergies as of 01/22/2024 - Review Complete 10/10/2023  Allergen Reaction Noted   Lactose Diarrhea 10/24/2014   Lactose intolerance (gi)  10/24/2014   Metformin hcl Diarrhea 05/06/2019   Silicone Other (See Comments) 10/24/2014   Adhesive [tape] Rash 10/24/2014   Erythromycin Rash 10/24/2014   Penicillins Rash 10/24/2014    Family History  Problem Relation Age of Onset   Breast cancer Cousin        mat cousin    Social History   Socioeconomic History   Marital status: Married    Spouse name:  Not on file   Number of children: Not on file   Years of education: Not on file   Highest education level: Not on file  Occupational History   Not on file  Tobacco Use   Smoking status: Never    Passive exposure: Never   Smokeless tobacco: Never  Vaping Use   Vaping status: Never Used  Substance and Sexual Activity   Alcohol use: No   Drug use: No   Sexual activity: Not on file  Other Topics Concern   Not on file  Social History Narrative   Not on file   Social Drivers of Health   Financial Resource Strain: Low Risk  (01/22/2024)   Received from [facility]   Overall Financial Resource Strain (CARDIA)    Difficulty of Paying Living Expenses: Not hard at all  Food Insecurity: No  Food Insecurity (01/22/2024)   Received from [facility]   Hunger Vital Sign    Within the past 12 months, you worried that your food would run out before you got the money to buy more.: Never true    Within the past 12 months, the food you bought just didn't last and you didn't have money to get more.: Never true  Transportation Needs: No Transportation Needs (01/22/2024)   Received from [facility] - Transportation    In the past 12 months, has lack of transportation kept you from medical appointments or from getting medications?: No    Lack of Transportation (Non-Medical): No  Physical Activity: Insufficiently Active (10/17/2021)   Received from [facility]   Exercise Vital Sign    On average, how many days per week do you engage in moderate to strenuous exercise (like a brisk walk)?: 2 days    On average, how many minutes do you engage in exercise at this level?: 60 min  Stress: No Stress Concern Present (10/17/2021)   Received from [facility] - Occupational Stress Questionnaire    Feeling of Stress : Not at all  Social Connections: Socially Integrated (10/17/2021)   Received from [facility]   Social Connection and Isolation Panel    In a typical week, how many times do you talk on the phone with family, friends, or neighbors?: More than three times a week    How often do you get together with friends or relatives?: More than three times a week    How often do you attend church or religious services?: More than 4 times per year    Do you belong to any clubs or organizations such as church groups, unions, fraternal or athletic groups, or school groups?: Yes    How often do you attend meetings of the clubs or organizations you belong to?: More than 4 times per year    Are you married, widowed, divorced, separated, never married, or living with a partner?: Married  Intimate Partner Violence:  Not At Risk (10/17/2021)   Received from [facility]   Humiliation, Afraid, Rape, and Kick questionnaire    Within the last year, have you been afraid of your partner or ex-partner?: No    Within the last year, have you been humiliated or emotionally abused in other ways by your partner or ex-partner?: No    Within the last year, have you been kicked, hit, slapped, or otherwise physically hurt by your partner or ex-partner?: No  Within the last year, have you been raped or forced to have any kind of sexual activity by your partner or ex-partner?: No    Review of Systems: See HPI, otherwise negative ROS  Physical Exam: BP (!) 146/63   Pulse (!) 58   Temp (!) 96.9 F (36.1 C)   Resp 16   Ht 5' 6 (1.676 m)   Wt 68.1 kg   SpO2 100%   BMI 24.24 kg/m  General:   Alert,  pleasant and cooperative in NAD Head:  Normocephalic and atraumatic. Neck:  Supple; no masses or thyromegaly. Lungs:  Clear throughout to auscultation.    Heart:  Regular rate and rhythm. Abdomen:  Soft, nontender and nondistended. Normal bowel sounds, without guarding, and without rebound.   Neurologic:  Alert and  oriented x4;  grossly normal neurologically.  Impression/Plan: Dominique Spencer is here for an colonoscopy to be performed for chronic diarrhea, elevated fecal calprotectin  Risks, benefits, limitations, and alternatives regarding  colonoscopy have been reviewed with the patient.  Questions have been answered.  All parties agreeable.   Corinn Brooklyn, MD  01/29/2024, 9:38 AM

## 2024-01-30 ENCOUNTER — Ambulatory Visit: Payer: Self-pay | Admitting: Gastroenterology

## 2024-01-30 LAB — SURGICAL PATHOLOGY

## 2024-01-30 NOTE — Progress Notes (Signed)
 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  Recommend to start budesonide 3 mg 3 pills in the morning, 30 days with 3 refills.  I will see her for follow-up as scheduled  Thanks RV

## 2024-04-22 ENCOUNTER — Other Ambulatory Visit: Payer: Self-pay

## 2024-04-22 MED ORDER — FLUZONE HIGH-DOSE 0.5 ML IM SUSY
0.5000 mL | PREFILLED_SYRINGE | Freq: Once | INTRAMUSCULAR | 0 refills | Status: AC
  Filled 2024-04-22: qty 0.5, 1d supply, fill #0

## 2024-10-04 ENCOUNTER — Ambulatory Visit: Admitting: Urology
